# Patient Record
Sex: Male | Born: 1945 | Hispanic: Yes | Marital: Married | Smoking: Former smoker
Health system: Southern US, Community
[De-identification: ages and names within clinical notes are randomized; demographics above are authoritative.]

## PROBLEM LIST (undated history)

## (undated) DIAGNOSIS — K649 Unspecified hemorrhoids: Secondary | ICD-10-CM

## (undated) DIAGNOSIS — N138 Other obstructive and reflux uropathy: Secondary | ICD-10-CM

## (undated) DIAGNOSIS — M109 Gout, unspecified: Secondary | ICD-10-CM

## (undated) DIAGNOSIS — N401 Enlarged prostate with lower urinary tract symptoms: Secondary | ICD-10-CM

## (undated) DIAGNOSIS — R350 Frequency of micturition: Secondary | ICD-10-CM

## (undated) DIAGNOSIS — R3915 Urgency of urination: Secondary | ICD-10-CM

## (undated) DIAGNOSIS — R351 Nocturia: Secondary | ICD-10-CM

## (undated) DIAGNOSIS — N529 Male erectile dysfunction, unspecified: Secondary | ICD-10-CM

## (undated) DIAGNOSIS — I10 Essential (primary) hypertension: Secondary | ICD-10-CM

## (undated) HISTORY — DX: Benign prostatic hyperplasia with lower urinary tract symptoms: N13.8

## (undated) HISTORY — DX: Unspecified hemorrhoids: K64.9

## (undated) HISTORY — DX: Urgency of urination: R39.15

## (undated) HISTORY — DX: Essential (primary) hypertension: I10

## (undated) HISTORY — DX: Frequency of micturition: R35.0

## (undated) HISTORY — DX: Gout, unspecified: M10.9

## (undated) HISTORY — PX: COLONOSCOPY: SHX174

## (undated) HISTORY — DX: Male erectile dysfunction, unspecified: N52.9

## (undated) HISTORY — DX: Benign prostatic hyperplasia with lower urinary tract symptoms: N40.1

## (undated) HISTORY — DX: Nocturia: R35.1

---

## 2004-11-14 ENCOUNTER — Ambulatory Visit: Payer: Self-pay

## 2006-03-23 ENCOUNTER — Emergency Department: Payer: Self-pay | Admitting: Internal Medicine

## 2006-03-23 ENCOUNTER — Other Ambulatory Visit: Payer: Self-pay

## 2006-03-24 ENCOUNTER — Ambulatory Visit: Payer: Self-pay | Admitting: Internal Medicine

## 2009-01-09 ENCOUNTER — Ambulatory Visit: Payer: Self-pay | Admitting: Family Medicine

## 2010-08-27 ENCOUNTER — Ambulatory Visit: Payer: Self-pay | Admitting: Internal Medicine

## 2011-07-24 ENCOUNTER — Emergency Department: Payer: Self-pay | Admitting: Emergency Medicine

## 2013-12-22 ENCOUNTER — Ambulatory Visit: Payer: Self-pay | Admitting: Gastroenterology

## 2015-05-08 ENCOUNTER — Encounter: Payer: Self-pay | Admitting: *Deleted

## 2015-05-09 ENCOUNTER — Encounter: Payer: Self-pay | Admitting: Urology

## 2015-05-09 ENCOUNTER — Ambulatory Visit (INDEPENDENT_AMBULATORY_CARE_PROVIDER_SITE_OTHER): Payer: Medicare Other | Admitting: Urology

## 2015-05-09 VITALS — BP 136/69 | HR 69 | Ht 75.0 in | Wt 199.7 lb

## 2015-05-09 DIAGNOSIS — N138 Other obstructive and reflux uropathy: Secondary | ICD-10-CM

## 2015-05-09 DIAGNOSIS — N486 Induration penis plastica: Secondary | ICD-10-CM | POA: Diagnosis not present

## 2015-05-09 DIAGNOSIS — N401 Enlarged prostate with lower urinary tract symptoms: Secondary | ICD-10-CM

## 2015-05-09 MED ORDER — FINASTERIDE 5 MG PO TABS: 5.0000 mg | ORAL_TABLET | Freq: Every day | ORAL | Status: DC

## 2015-05-09 MED ORDER — PENTOXIFYLLINE ER 400 MG PO TBCR: 400.0000 mg | EXTENDED_RELEASE_TABLET | Freq: Three times a day (TID) | ORAL | Status: DC

## 2015-05-09 MED ORDER — TAMSULOSIN HCL 0.4 MG PO CAPS: 0.4000 mg | ORAL_CAPSULE | Freq: Every day | ORAL | Status: DC

## 2015-05-09 NOTE — Progress Notes (Signed)
05/09/2015 7:29 AM   Alejandro Steele [birth year] [id number]  Referring provider: No referring provider defined for this encounter.  Chief Complaint  Patient presents with  . Benign Prostatic Hypertrophy    patient not seen since 2014, patient states has pain in lower abdomen    HPI: [name] is a 69 year old [place] male who presents today with interpreter Trudy complaining of urinary frequency, urgency, nocturia getting up 3-4 times nightly, urinary tract infection and penile pain. Patient has not been seen in our office since 2014.  At his visit in 2014, he was diagnosed with BPH with LUTS and placed on tamsulosin and finasteride. Patient did not understand that these are long term medications and when his symptoms abated, he stopped the medication and did not return for follow-up appointments.  He has not experienced any gross hematuria, fevers, chills, nausea, vomiting or dysuria. He is having suprapubic discomfort.  He also has been having some slight penile pain with his erections. He denies any curvature with erections or pain with ejaculation. He has been noticing this pain for the last several months. It is not affected his firmness or durations of erections.     PMH: Past Medical History  Diagnosis Date  . Nocturia   . Urinary frequency   . Erectile dysfunction   . Hypertension   . Hemorrhoid   . Gout   . Urinary urgency   . BPH with obstruction/lower urinary tract symptoms     Surgical History: Past Surgical History  Procedure Laterality Date  . Colonoscopy      Home Medications:    Medication List       This list is accurate as of: 05/09/15 11:59 PM.  Always use your most recent med list.               atenolol 50 MG tablet  Commonly known as:  TENORMIN  Take 50 mg by mouth daily.     cetirizine 10 MG tablet  Commonly known as:  ZYRTEC  Take 10 mg by mouth daily.     clotrimazole 1 % cream  Commonly known as:  LOTRIMIN  Apply 1  application topically 2 (two) times daily.     colchicine 0.6 MG tablet  Take 0.6 mg by mouth daily.     finasteride 5 MG tablet  Commonly known as:  PROSCAR  Take 1 tablet (5 mg total) by mouth daily.     finasteride 5 MG tablet  Commonly known as:  PROSCAR  Take 1 tablet (5 mg total) by mouth daily.     fluticasone 50 MCG/ACT nasal spray  Commonly known as:  FLONASE  Place 2 sprays into both nostrils daily.     indomethacin 25 MG capsule  Commonly known as:  INDOCIN  Take 25 mg by mouth 2 (two) times daily with a meal.     naproxen 500 MG tablet  Commonly known as:  NAPROSYN  Take 500 mg by mouth 2 (two) times daily with a meal.     pentoxifylline 400 MG CR tablet  Commonly known as:  TRENTAL  Take 1 tablet (400 mg total) by mouth 3 (three) times daily with meals.     pravastatin 40 MG tablet  Commonly known as:  PRAVACHOL  Take 40 mg by mouth daily.     sildenafil 100 MG tablet  Commonly known as:  VIAGRA  Take 100 mg by mouth daily as needed for erectile dysfunction.     tamsulosin 0.4  MG Caps capsule  Commonly known as:  FLOMAX  Take 1 capsule (0.4 mg total) by mouth daily.     tamsulosin 0.4 MG Caps capsule  Commonly known as:  FLOMAX  Take 1 capsule (0.4 mg total) by mouth daily.        Allergies:  Allergies  Allergen Reactions  . Penicillins     Family History: Family History  Problem Relation Age of Onset  . Stroke Father   . Diabetes Mellitus II Mother   . Cancer Neg Hx     Kidney,Prostate,Bladder  . Diabetes      Social History:  reports that he has never smoked. He does not have any smokeless tobacco history on file. He reports that he does not drink alcohol or use illicit drugs.  ROS: Urological Symptom Review  Patient is experiencing the following symptoms: Frequent urination Hard to postpone urination Get up at night to urinate Urinary tract infection Penile pain (male only)    Review of Systems  Gastrointestinal (upper)   : Indigestion/heartburn  Gastrointestinal (lower) : Negative for lower GI symptoms  Constitutional : Negative for symptoms  Skin: Negative for skin symptoms  Eyes: Negative for eye symptoms  Ear/Nose/Throat : Negative for Ear/Nose/Throat symptoms  Hematologic/Lymphatic: Negative for Hematologic/Lymphatic symptoms  Cardiovascular : Negative for cardiovascular symptoms  Respiratory : Negative for respiratory symptoms  Endocrine: Excessive thirst  Musculoskeletal: Back pain Joint pain  Neurological: Negative for neurological symptoms  Psychologic: Negative for psychiatric symptoms   Physical Exam: BP 136/69 mmHg  Pulse 69  Ht  (1.905 m)  Wt 199 lb 11.2 oz (90.583 kg)  BMI 24.96 kg/m2  GU: Patient with uncircumcised phallus. Foreskin easily retracted.  Urethral meatus is patent.  No penile discharge. Patient with a Peyronie's plaque on the dorsal surface of the penis near the base, 5 mm x 20 mm.  No penile  rashes. Scrotum without lesions, cysts, rashes and/or edema.  Testicles are located scrotally bilaterally. No masses are appreciated in the testicles. Left and right epididymis are normal. Rectal: Patient with  normal sphincter tone. Perineum without scarring or rashes. No rectal masses are appreciated. Prostate is approximately 60 grams, no nodules are appreciated. Seminal vesicles are normal.   Laboratory Data: No results found for this or any previous visit. No results found for: WBC, HGB, HCT, MCV, PLT  No results found for: CREATININE  No results found for: PSA  No results found for: TESTOSTERONE  No results found for: HGBA1C  Urinalysis No results found for: COLORURINE, APPEARANCEUR, LABSPEC, PHURINE, GLUCOSEU, HGBUR, BILIRUBINUR, KETONESUR, PROTEINUR, UROBILINOGEN, NITRITE, LEUKOCYTESUR  Pertinent Imaging:   Assessment & Plan:    1. BPH with LUTS:  Patient has a prior history of BPH with lots and had been on medication for this  condition in the past. He did not understand that the medication was to be taken long-term and follow-ups were necessary. His DRE demonstrates enlargement.  We will restart tamsulosin and finasteride.  He is advised to take tamsulosin  30 minutes after a meal.  I advised him of the side effects, such as: retrograde ejaculation, sinus congestion, nasal congestion, rhinorrhea, rhinitis, dizziness, and seasonal allergic rhinitis.  The side effects of finasteride are also discussed with the patient, such as: impotence, loss of interest in sex, or trouble having an orgasm; abnormal ejaculation; swelling in your hands and/or feet; swelling and/or tenderness in your breasts.  He will follow up in one month for a PSA,  PVR and an  IPSS.    2. 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 We will start him on pentoxifylline 400 mg 1 by mouth 3 times a day. We will reevaluate this area and obtain an SHIM in one month   Return in about 1 month (around 06/08/2015) for IPSS, SHIM, PVR and PSA.  Michiel Cowboy, PA-C  [facility] [address] [place], [place] [address] [phone]

## 2015-05-10 MED ORDER — FINASTERIDE 5 MG PO TABS: 5.0000 mg | ORAL_TABLET | Freq: Every day | ORAL | Status: DC

## 2015-05-10 MED ORDER — TAMSULOSIN HCL 0.4 MG PO CAPS: 0.4000 mg | ORAL_CAPSULE | Freq: Every day | ORAL | Status: DC

## 2015-05-13 DIAGNOSIS — N401 Enlarged prostate with lower urinary tract symptoms: Secondary | ICD-10-CM

## 2015-05-13 DIAGNOSIS — N138 Other obstructive and reflux uropathy: Secondary | ICD-10-CM | POA: Insufficient documentation

## 2015-05-13 DIAGNOSIS — N486 Induration penis plastica: Secondary | ICD-10-CM | POA: Insufficient documentation

## 2015-06-08 ENCOUNTER — Ambulatory Visit: Payer: Medicare Other | Admitting: Urology

## 2015-06-18 ENCOUNTER — Telehealth: Payer: Self-pay | Admitting: *Deleted

## 2015-06-18 NOTE — Telephone Encounter (Signed)
Pt. stated that he has finished his Rx of  pentoxifylline (Trental)  and wanted to know if he should continue taking.  I explained to the pt that this Rx was written on 05/09/15, #90 w/3 refills; he has another refill left and should continue this medication until his f/u appointment on 06/22/2015 ( Friday) w/Shannon Mcgowan, PA-C.  This pt is Latino and speaks little english however he did indicate understanding and repeated his appointment date and time. . . sm

## 2015-06-22 ENCOUNTER — Encounter: Payer: Self-pay | Admitting: Urology

## 2015-06-22 ENCOUNTER — Ambulatory Visit (INDEPENDENT_AMBULATORY_CARE_PROVIDER_SITE_OTHER): Payer: Medicare Other | Admitting: Urology

## 2015-06-22 VITALS — BP 150/80 | HR 60 | Ht 69.0 in | Wt 195.4 lb

## 2015-06-22 DIAGNOSIS — N486 Induration penis plastica: Secondary | ICD-10-CM

## 2015-06-22 DIAGNOSIS — N401 Enlarged prostate with lower urinary tract symptoms: Secondary | ICD-10-CM

## 2015-06-22 DIAGNOSIS — N138 Other obstructive and reflux uropathy: Secondary | ICD-10-CM

## 2015-06-22 LAB — BLADDER SCAN AMB NON-IMAGING

## 2015-06-22 NOTE — Progress Notes (Signed)
2:50 PM   Alejandro Steele 04/20/1946 [id number]  Referring provider: No referring provider defined for this encounter.  Chief Complaint  Patient presents with  . Benign Prostatic Hypertrophy    HPI: [name] is a 69 year old [place] male who presents today with interpreter, Orson Slick, for follow-up after being placed on tamsulosin and finasteride for symptoms of  urinary frequency, urgency, nocturia getting up 3-4 times nightly, urinary tract infection and penile pain.    Patient had not been seen in our office since 2014.  At his visit in 2014, he was diagnosed with BPH with LUTS and placed on tamsulosin and finasteride. Patient did not understand that these are long term medications and when his symptoms abated, he stopped the medication and did not return for follow-up appointments.  His urinary symptoms did return and he presented to our clinic 1 month ago for further evaluation and treatment.  He has not experienced any gross hematuria, fevers, chills, nausea, vomiting or dysuria.   Today, he states he has felt some improvement since starting the tamsulosin and finasteride.      IPSS      06/22/15 0900       International Prostate Symptom Score   How often have you had the sensation of not emptying your bladder? Almost always     How often have you had to urinate less than every two hours? Almost always     How often have you found you stopped and started again several times when you urinated? More than half the time     How often have you found it difficult to postpone urination? Almost always     How often have you had a weak urinary stream? More than half the time     How often have you had to strain to start urination? Not at All     How many times did you typically get up at night to urinate? 4 Times     Total IPSS Score 27     Quality of Life due to urinary symptoms   If you were to spend the rest of your life with your urinary condition just the way it is  now how would you feel about that? Mostly Disatisfied        Score:  1-7 Mild 8-19 Moderate 20-35 Severe  He was also has been having some slight penile pain with his erections. He denied any curvature with erections or pain with ejaculation. He has been noticing this pain for the last several months. It is not affected his firmness or durations of erections. He was found to have a Peyronie's plaque on the dorsal surface of his penis during his exam last month. He was placed on pentoxifylline 400 mg 1 by mouth 3 times daily. He states he has noticed the penile pain has diminished.  PMH: Past Medical History  Diagnosis Date  . Nocturia   . Urinary frequency   . Erectile dysfunction   . Hypertension   . Hemorrhoid   . Gout   . Urinary urgency   . BPH with obstruction/lower urinary tract symptoms     Surgical History: Past Surgical History  Procedure Laterality Date  . Colonoscopy      Home Medications:    Medication List       This list is accurate as of: 06/22/15 11:59 PM.  Always use your most recent med list.  atenolol 50 MG tablet  Commonly known as:  TENORMIN  Take 50 mg by mouth daily.     clotrimazole 1 % cream  Commonly known as:  LOTRIMIN  Apply 1 application topically 2 (two) times daily.     colchicine 0.6 MG tablet  Take 0.6 mg by mouth daily.     finasteride 5 MG tablet  Commonly known as:  PROSCAR  Take 1 tablet (5 mg total) by mouth daily.     indomethacin 25 MG capsule  Commonly known as:  INDOCIN  Take 25 mg by mouth 2 (two) times daily with a meal.     pentoxifylline 400 MG CR tablet  Commonly known as:  TRENTAL  Take 1 tablet (400 mg total) by mouth 3 (three) times daily with meals.     pravastatin 20 MG tablet  Commonly known as:  PRAVACHOL     sildenafil 100 MG tablet  Commonly known as:  VIAGRA  Take 100 mg by mouth daily as needed for erectile dysfunction.     tamsulosin 0.4 MG Caps capsule  Commonly known as:   FLOMAX  Take 1 capsule (0.4 mg total) by mouth daily.        Allergies:  Allergies  Allergen Reactions  . Penicillins     Family History: Family History  Problem Relation Age of Onset  . Stroke Father   . Diabetes Mellitus II Mother   . Cancer Neg Hx     Kidney,Prostate,Bladder  . Diabetes      Social History:  reports that he has never smoked. He does not have any smokeless tobacco history on file. He reports that he does not drink alcohol or use illicit drugs.  ROS: Urological Symptom Review  Patient is experiencing the following symptoms: Frequent urination Hard to postpone urination Get up at night to urinate Urinary tract infection Penile pain (male only)    Review of Systems  Gastrointestinal (upper)  : Indigestion/heartburn  Gastrointestinal (lower) : Negative for lower GI symptoms  Constitutional : Negative for symptoms  Skin: Negative for skin symptoms  Eyes: Negative for eye symptoms  Ear/Nose/Throat : Negative for Ear/Nose/Throat symptoms  Hematologic/Lymphatic: Negative for Hematologic/Lymphatic symptoms  Cardiovascular : Negative for cardiovascular symptoms  Respiratory : Negative for respiratory symptoms  Endocrine: Excessive thirst  Musculoskeletal: Back pain Joint pain  Neurological: Negative for neurological symptoms  Psychologic: Negative for psychiatric symptoms   Physical Exam: BP 150/80 mmHg  Pulse 60  Ht 5\' 9"  (1.753 m)  Wt 195 lb 6.4 oz (88.633 kg)  BMI 28.84 kg/m2  GU: Patient with uncircumcised phallus. Foreskin easily retracted.  Urethral meatus is patent.  No penile discharge. Patient with a Peyronie's plaque on the dorsal surface of the penis near the base, 5 mm x 8 mm.  No penile  rashes. Scrotum without lesions, cysts, rashes and/or edema.  Testicles are located scrotally bilaterally. No masses are appreciated in the testicles. Left and right epididymis are normal. Rectal: Patient with  normal sphincter  tone. Perineum without scarring or rashes. No rectal masses are appreciated. Prostate is approximately 60 grams, no nodules are appreciated. Seminal vesicles are normal.   Laboratory Data: Results for orders placed or performed in visit on 06/22/15  PSA  Result Value Ref Range   Prostate Specific Ag, Serum 0.7 0.0 - 4.0 ng/mL  BLADDER SCAN AMB NON-IMAGING  Result Value Ref Range   Scan Result 14ml    Pertinent Imaging:   Assessment & Plan:  1. BPH with LUTS:  Patient's symptoms are better since restarting the tamsulosin and finasteride.  His IPSS score today is 27 and he is mostly dissatisfied with the urinary results. But when asked directly, he states his symptoms are improving since restarting the tamsulosin and finasteride.  We will continue these medications and the patient will follow up in 3 months for a PVR and an IPSS.  2. 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 On today's exam is is reduced.  He will continue the pentoxifylline 400 mg 1 by mouth 3 times a day. We will reevaluate this area and obtain an SHIM in three months.   Return in about 3 months (around 09/22/2015) for IPSS and PVR and exam.  Michiel Cowboy, [facility] [address] [place], [place] [address] [phone]

## 2015-06-23 LAB — PSA: PROSTATE SPECIFIC AG, SERUM: 0.7 ng/mL (ref 0.0–4.0)

## 2015-06-25 ENCOUNTER — Telehealth: Payer: Self-pay

## 2015-06-25 NOTE — Telephone Encounter (Signed)
-----   Message from Ashley Brandon, MD sent at 06/25/2015 12:19 PM EDT ----- PSA 0.7, wnl.  Please let patient know.    Ashley Brandon, MD  

## 2015-06-25 NOTE — Telephone Encounter (Signed)
No vm has been set up yet.

## 2015-06-27 NOTE — Telephone Encounter (Signed)
-----   Message from Ashley Brandon, MD sent at 06/25/2015 12:19 PM EDT ----- PSA 0.7, wnl.  Please let patient know.    Ashley Brandon, MD  

## 2015-06-28 NOTE — Telephone Encounter (Signed)
Will send letter.

## 2015-06-28 NOTE — Telephone Encounter (Signed)
-----   Message from Vanna Scotland, MD sent at 06/25/2015 12:19 PM EDT ----- PSA 0.7, wnl.  Please let patient know.    Vanna Scotland, MD

## 2015-08-27 ENCOUNTER — Ambulatory Visit: Payer: Medicare Other | Admitting: Obstetrics and Gynecology

## 2015-08-28 ENCOUNTER — Ambulatory Visit: Payer: Medicare Other | Admitting: Obstetrics and Gynecology

## 2015-08-29 ENCOUNTER — Encounter: Payer: Self-pay | Admitting: Obstetrics and Gynecology

## 2015-08-29 ENCOUNTER — Ambulatory Visit (INDEPENDENT_AMBULATORY_CARE_PROVIDER_SITE_OTHER): Payer: Medicare Other | Admitting: Obstetrics and Gynecology

## 2015-08-29 VITALS — BP 116/73 | HR 61 | Resp 18 | Ht 70.0 in | Wt 190.7 lb

## 2015-08-29 DIAGNOSIS — N4889 Other specified disorders of penis: Secondary | ICD-10-CM

## 2015-08-29 DIAGNOSIS — N401 Enlarged prostate with lower urinary tract symptoms: Secondary | ICD-10-CM | POA: Diagnosis not present

## 2015-08-29 DIAGNOSIS — N138 Other obstructive and reflux uropathy: Secondary | ICD-10-CM

## 2015-08-29 LAB — URINALYSIS, COMPLETE
BILIRUBIN UA: NEGATIVE
GLUCOSE, UA: NEGATIVE
Ketones, UA: NEGATIVE
Leukocytes, UA: NEGATIVE
NITRITE UA: NEGATIVE
Protein, UA: NEGATIVE
SPEC GRAV UA: 1.01 (ref 1.005–1.030)
UUROB: 0.2 mg/dL (ref 0.2–1.0)
pH, UA: 5.5 (ref 5.0–7.5)

## 2015-08-29 LAB — MICROSCOPIC EXAMINATION
Bacteria, UA: NONE SEEN
Epithelial Cells (non renal): NONE SEEN /hpf (ref 0–10)
Renal Epithel, UA: NONE SEEN /hpf
WBC, UA: NONE SEEN /hpf (ref 0–?)

## 2015-08-29 MED ORDER — FINASTERIDE 5 MG PO TABS
5.0000 mg | ORAL_TABLET | Freq: Every day | ORAL | Status: DC
Start: 2015-08-29 — End: 2016-07-03

## 2015-08-29 MED ORDER — TAMSULOSIN HCL 0.4 MG PO CAPS: 0.4000 mg | ORAL_CAPSULE | Freq: Every day | ORAL | Status: DC

## 2015-08-29 NOTE — Progress Notes (Signed)
08/29/2015 10:21 AM   Alejandro Steele June 25, 1946 [id number]  Referring provider: No referring provider defined for this encounter.  Chief Complaint  Patient presents with  . Benign Prostatic Hypertrophy    HPI:  Patient presents for f/u for BPH with LUTS and penile pain.Today, he states he has felt much improvement in urinary symptoms since starting the tamsulosin and finasteride. However he continues to experience urgency, nocturia 4 times per night and occasional urge incontinence which he is significantly bothered by. His I-PSS score has improved from 27 down to 14 today though he states he remains unhappy with his current symptoms.  He penile pain has completely resolved.  He states no change in penile curvature that has been present his whole life.  Previous Complaint History: Alejandro Steele is a 69 year old [place] male who presents today with interpreter, Alejandro Steele, for follow-up after being placed on tamsulosin and finasteride for symptoms of urinary frequency, urgency, nocturia getting up 3-4 times nightly, urinary tract infection and penile pain.   Patient had not been seen in our office since 2014. At his visit in 2014, he was diagnosed with BPH with LUTS and placed on tamsulosin and finasteride. Patient did not understand that these are long term medications and when his symptoms abated, he stopped the medication and did not return for follow-up appointments. His urinary symptoms did return and he presented to our clinic 1 month ago for further evaluation and treatment. He has not experienced any gross hematuria, fevers, chills, nausea, vomiting or dysuria.    PMH: Past Medical History  Diagnosis Date  . Nocturia   . Urinary frequency   . Erectile dysfunction   . Hypertension   . Hemorrhoid   . Gout   . Urinary urgency   . BPH with obstruction/lower urinary tract symptoms     Surgical History: Past Surgical History  Procedure Laterality Date  .  Colonoscopy      Home Medications:    Medication List       This list is accurate as of: 08/29/15 11:59 PM.  Always use your most recent med list.               atenolol 50 MG tablet  Commonly known as:  TENORMIN  Take 50 mg by mouth daily.     clotrimazole 1 % cream  Commonly known as:  LOTRIMIN  Apply 1 application topically 2 (two) times daily.     colchicine 0.6 MG tablet  Take 0.6 mg by mouth daily.     finasteride 5 MG tablet  Commonly known as:  PROSCAR  Take 1 tablet (5 mg total) by mouth daily.     HYDROcodone-acetaminophen 5-325 MG tablet  Commonly known as:  NORCO/VICODIN  take 1 tablet by mouth every 6 hours if needed for SEVERE PAIN     indomethacin 25 MG capsule  Commonly known as:  INDOCIN  Take 25 mg by mouth 2 (two) times daily with a meal.     pravastatin 20 MG tablet  Commonly known as:  PRAVACHOL     sildenafil 100 MG tablet  Commonly known as:  VIAGRA  Take 100 mg by mouth daily as needed for erectile dysfunction.     tamsulosin 0.4 MG Caps capsule  Commonly known as:  FLOMAX  Take 1 capsule (0.4 mg total) by mouth daily.        Allergies:  Allergies  Allergen Reactions  . Penicillins     Family History: Family History  Problem Relation Age of Onset  . Stroke Father   . Diabetes Mellitus II Mother   . Cancer Neg Hx     Kidney,Prostate,Bladder  . Diabetes      Social History:  reports that he has never smoked. He does not have any smokeless tobacco history on file. He reports that he does not drink alcohol or use illicit drugs.  ROS: UROLOGY Frequent Urination?: Yes Hard to postpone urination?: Yes Burning/pain with urination?: No Get up at night to urinate?: Yes Leakage of urine?: No Urine stream starts and stops?: No Trouble starting stream?: No Do you have to strain to urinate?: No Blood in urine?: No Urinary tract infection?: No Sexually transmitted disease?: No Injury to kidneys or bladder?: No Painful  intercourse?: No Weak stream?: No Erection problems?: Yes Penile pain?: No  Gastrointestinal Nausea?: No Vomiting?: No Indigestion/heartburn?: No Diarrhea?: No Constipation?: No  Constitutional Fever: No Night sweats?: No Weight loss?: No Fatigue?: No  Skin Skin rash/lesions?: No Itching?: No  Eyes Blurred vision?: No Double vision?: No  Ears/Nose/Throat Sore throat?: No Sinus problems?: No  Hematologic/Lymphatic Swollen glands?: No Easy bruising?: No  Cardiovascular Leg swelling?: No Chest pain?: No  Respiratory Cough?: No Shortness of breath?: No  Endocrine Excessive thirst?: Yes  Musculoskeletal Back pain?: No Joint pain?: No  Neurological Headaches?: No Dizziness?: No  Psychologic Depression?: No Anxiety?: No  Physical Exam: BP 116/73 mmHg  Pulse 61  Resp 18  Ht 5\' 10"  (1.778 m)  Wt 190 lb 11.2 oz (86.501 kg)  BMI 27.36 kg/m2  Constitutional:  Alert and oriented, No acute distress. HEENT: [place] AT, moist mucus membranes.  Trachea midline, no masses. Cardiovascular: No clubbing, cyanosis, or edema. Respiratory: Normal respiratory effort, no increased work of breathing. GI: Abdomen is soft, nontender, nondistended, no abdominal masses GU: No CVA tenderness, no palpable penile plaque  Skin: No rashes, bruises or suspicious lesions. Lymph: No cervical or inguinal adenopathy. Neurologic: Grossly intact, no focal deficits, moving all 4 extremities. Psychiatric: Normal mood and affect.  Laboratory Data: No results found for: WBC, HGB, HCT, MCV, PLT  No results found for: CREATININE  Lab Results  Component Value Date   PSA 0.7 06/22/2015    No results found for: TESTOSTERONE  No results found for: HGBA1C  Urinalysis    Component Value Date/Time   GLUCOSEU Negative 08/29/2015 1029   BILIRUBINUR Negative 08/29/2015 1029   NITRITE Negative 08/29/2015 1029   LEUKOCYTESUR Negative 08/29/2015 1029    Pertinent  Imaging:   Assessment & Plan:    1. BPH with LUTS- UA unremarkable. PVR 36 mL's. Patient is very unhappy with continued symptoms.  We discussed scheduling a cystoscopy in the future for further evaluation of his bladder outlet. Patient states he would like to continue his medications for a few more months. He is planning to go to [place] in a few weeks and will return in January. He would like to follow up after he returns for recheck prior to scheduling a cystoscopy.   3. Peyronie's - No penile pain or discomfort.  Patient reports same curvature his whole life. We will discontinue pentoxyline today and continue to monitor symptoms.  There are no diagnoses linked to this encounter.  Return for f/u with Carollee HerterShannon in February.  Earlie LouLindsay Fallan Mccarey, FNP  [facility] [address], [address] [place], [place] [address] [phone]

## 2015-08-29 NOTE — Patient Instructions (Addendum)
Stop pentoxifylline. Continue Finasteride and Flomax daily.

## 2015-09-24 ENCOUNTER — Ambulatory Visit: Payer: Medicare Other | Admitting: Urology

## 2016-01-01 ENCOUNTER — Ambulatory Visit: Payer: Medicare Other | Admitting: Urology

## 2016-01-03 ENCOUNTER — Encounter: Payer: Self-pay | Admitting: Urology

## 2016-01-03 ENCOUNTER — Ambulatory Visit (INDEPENDENT_AMBULATORY_CARE_PROVIDER_SITE_OTHER): Payer: Medicare Other | Admitting: Urology

## 2016-01-03 VITALS — BP 156/78 | HR 57 | Ht 70.0 in | Wt 196.5 lb

## 2016-01-03 DIAGNOSIS — N138 Other obstructive and reflux uropathy: Secondary | ICD-10-CM

## 2016-01-03 DIAGNOSIS — N486 Induration penis plastica: Secondary | ICD-10-CM | POA: Diagnosis not present

## 2016-01-03 DIAGNOSIS — N401 Enlarged prostate with lower urinary tract symptoms: Secondary | ICD-10-CM

## 2016-01-03 LAB — BLADDER SCAN AMB NON-IMAGING: SCAN RESULT: 0

## 2016-01-03 NOTE — Progress Notes (Signed)
9:36 AM   Alejandro Steele 1946/05/06 [id number]  Referring provider: No referring provider defined for this encounter.  Chief Complaint  Patient presents with  . Benign Prostatic Hypertrophy    follow up    HPI: Patient is a 70 year old [place] male who presents with interpreter, Alejandro Steele, for follow-up to discuss his results with tamsulosin and finasteride.  He also has had a history of Peyronie's disease and we will inquire about the status at today's visit.  Today, he states he has felt much improvement in urinary symptoms since starting the tamsulosin and finasteride.  His I-PSS score has improved from 14 down to 13 today.  He is mostly satisfied with his urinary symptoms at this time.  His PVR is 0mL.        IPSS      01/03/16 0900       International Prostate Symptom Score   How often have you had the sensation of not emptying your bladder? Less than 1 in 5     How often have you had to urinate less than every two hours? More than half the time     How often have you found you stopped and started again several times when you urinated? Less than 1 in 5 times     How often have you found it difficult to postpone urination? More than half the time     How often have you had a weak urinary stream? Less than 1 in 5 times     How often have you had to strain to start urination? Not at All     How many times did you typically get up at night to urinate? 2 Times     Total IPSS Score 13     Quality of Life due to urinary symptoms   If you were to spend the rest of your life with your urinary condition just the way it is now how would you feel about that? Mostly Satisfied        Score:  1-7 Mild 8-19 Moderate 20-35 Severe   He penile pain has completely resolved.  He states no change in penile curvature that has been present his whole life.  His SHIM score is 19.      SHIM      01/03/16 0922       SHIM: Over the last 6 months:   How do you rate your confidence  that you could get and keep an erection? High     When you had erections with sexual stimulation, how often were your erections hard enough for penetration (entering your partner)? Most Times (much more than half the time)     During sexual intercourse, how often were you able to maintain your erection after you had penetrated (entered) your partner? Slightly Difficult     During sexual intercourse, how difficult was it to maintain your erection to completion of intercourse? Slightly Difficult     When you attempted sexual intercourse, how often was it satisfactory for you? Difficult     SHIM Total Score   SHIM 19        Score: 1-7 Severe ED 8-11 Moderate ED 12-16 Mild-Moderate ED 17-21 Mild ED 22-25 No ED  He has not experienced any gross hematuria, fevers, chills, nausea, vomiting or dysuria.    PMH: Past Medical History  Diagnosis Date  . Nocturia   . Urinary frequency   . Erectile dysfunction   . Hypertension   .  Hemorrhoid   . Gout   . Urinary urgency   . BPH with obstruction/lower urinary tract symptoms     Surgical History: Past Surgical History  Procedure Laterality Date  . Colonoscopy    . None      Home Medications:    Medication List       This list is accurate as of: 01/03/16  9:36 AM.  Always use your most recent med list.               atenolol 50 MG tablet  Commonly known as:  TENORMIN  Take 50 mg by mouth daily.     clotrimazole 1 % cream  Commonly known as:  LOTRIMIN  Apply 1 application topically 2 (two) times daily.     colchicine 0.6 MG tablet  Take 0.6 mg by mouth daily.     finasteride 5 MG tablet  Commonly known as:  PROSCAR  Take 1 tablet (5 mg total) by mouth daily.     HYDROcodone-acetaminophen 5-325 MG tablet  Commonly known as:  NORCO/VICODIN  Reported on 01/03/2016     indomethacin 25 MG capsule  Commonly known as:  INDOCIN  Take 25 mg by mouth 2 (two) times daily with a meal. Reported on 01/03/2016     pravastatin  20 MG tablet  Commonly known as:  PRAVACHOL     sildenafil 100 MG tablet  Commonly known as:  VIAGRA  Take 100 mg by mouth daily as needed for erectile dysfunction. Reported on 01/03/2016     tamsulosin 0.4 MG Caps capsule  Commonly known as:  FLOMAX  Take 1 capsule (0.4 mg total) by mouth daily.        Allergies:  Allergies  Allergen Reactions  . Penicillins     Family History: Family History  Problem Relation Age of Onset  . Stroke Father   . Diabetes Mellitus II Mother   . Cancer Neg Hx     Kidney,Prostate,Bladder  . Diabetes      Social History:  reports that he has quit smoking. He does not have any smokeless tobacco history on file. He reports that he does not drink alcohol or use illicit drugs.  ROS: UROLOGY Frequent Urination?: Yes Hard to postpone urination?: Yes Burning/pain with urination?: No Get up at night to urinate?: Yes Leakage of urine?: No Urine stream starts and stops?: No Trouble starting stream?: No Do you have to strain to urinate?: No Blood in urine?: No Urinary tract infection?: No Sexually transmitted disease?: No Injury to kidneys or bladder?: No Painful intercourse?: No Weak stream?: No Erection problems?: No Penile pain?: No  Gastrointestinal Nausea?: No Vomiting?: No Indigestion/heartburn?: No Diarrhea?: No Constipation?: No  Constitutional Fever: No Night sweats?: No Weight loss?: No Fatigue?: No  Skin Skin rash/lesions?: No Itching?: No  Eyes Blurred vision?: No Double vision?: No  Ears/Nose/Throat Sore throat?: No Sinus problems?: No  Hematologic/Lymphatic Swollen glands?: No Easy bruising?: No  Cardiovascular Leg swelling?: No Chest pain?: No  Respiratory Cough?: No Shortness of breath?: No  Endocrine Excessive thirst?: No  Musculoskeletal Back pain?: Yes Joint pain?: Yes  Neurological Headaches?: No Dizziness?: No  Psychologic Depression?: No Anxiety?: No  Physical Exam: BP 156/78  mmHg  Pulse 57  Ht  (1.778 m)  Wt 196 lb 8 oz (89.132 kg)  BMI 28.19 kg/m2  Constitutional:  Alert and oriented, No acute distress. HEENT: [place] AT, moist mucus membranes.  Trachea midline, no masses. Cardiovascular: No clubbing, cyanosis, or  edema. Respiratory: Normal respiratory effort, no increased work of breathing. GI: Abdomen is soft, nontender, nondistended, no abdominal masses GU: No CVA tenderness, no palpable penile plaque  GU: No CVA tenderness.  No bladder fullness or masses.  Patient with uncircumcised phallus. Foreskin easily retracted Urethral meatus is patent.  No penile discharge. No penile lesions or rashes. Scrotum without lesions, cysts, rashes and/or edema.  Testicles are located scrotally bilaterally. No masses are appreciated in the testicles. Left and right epididymis are normal. Rectal: Patient with  normal sphincter tone. Anus and perineum without scarring or rashes. No rectal masses are appreciated. Prostate is approximately 50 grams, no nodules are appreciated. Seminal vesicles are normal. Skin: No rashes, bruises or suspicious lesions. Lymph: No cervical or inguinal adenopathy. Neurologic: Grossly intact, no focal deficits, moving all 4 extremities. Psychiatric: Normal mood and affect.  Laboratory Data: PSA history  0.7 ng/mL on 06/25/2015   Pertinent Imaging: Results for TEOMAN, GIRAUD (MRN [id number]) as of 01/03/2016 09:26  Ref. Range 01/03/2016 09:25  Scan Result Unknown 0    Assessment & Plan:    1. BPH with LUTS:   IPSS score is 13/2.  His PVR is 0 mL.  We will continue to monitor.  He will return to clinic in 6 months for an IPSS score, exam and PSA.    3. Peyronie's:   No penile pain or discomfort.  Patient reports same curvature his whole life. We will continue to monitor.   Return in about 6 months (around 07/02/2016) for IPSS score and SHIM score.  Michiel Cowboy, PA-C  [facility] [address],  [address] [place], [place] [address] [phone]

## 2016-02-06 ENCOUNTER — Other Ambulatory Visit: Payer: Self-pay | Admitting: Family Medicine

## 2016-02-06 ENCOUNTER — Ambulatory Visit
Admission: RE | Admit: 2016-02-06 | Discharge: 2016-02-06 | Disposition: A | Discharge status: Home / Self Care | Payer: Medicare Other | Source: Ambulatory Visit | Attending: Family Medicine | Admitting: Family Medicine

## 2016-02-06 DIAGNOSIS — M25462 Effusion, left knee: Secondary | ICD-10-CM | POA: Insufficient documentation

## 2016-02-06 DIAGNOSIS — M25562 Pain in left knee: Secondary | ICD-10-CM

## 2016-03-26 DIAGNOSIS — M1712 Unilateral primary osteoarthritis, left knee: Secondary | ICD-10-CM | POA: Insufficient documentation

## 2016-06-25 ENCOUNTER — Other Ambulatory Visit: Payer: Self-pay

## 2016-06-25 DIAGNOSIS — N400 Enlarged prostate without lower urinary tract symptoms: Secondary | ICD-10-CM

## 2016-06-26 ENCOUNTER — Other Ambulatory Visit: Payer: Medicare Other

## 2016-06-26 DIAGNOSIS — N400 Enlarged prostate without lower urinary tract symptoms: Secondary | ICD-10-CM

## 2016-06-27 LAB — PSA: PROSTATE SPECIFIC AG, SERUM: 0.5 ng/mL (ref 0.0–4.0)

## 2016-07-03 ENCOUNTER — Ambulatory Visit (INDEPENDENT_AMBULATORY_CARE_PROVIDER_SITE_OTHER): Payer: Medicare Other | Admitting: Urology

## 2016-07-03 ENCOUNTER — Encounter: Payer: Self-pay | Admitting: Urology

## 2016-07-03 VITALS — BP 122/68 | HR 66 | Ht 68.0 in | Wt 191.9 lb

## 2016-07-03 DIAGNOSIS — N486 Induration penis plastica: Secondary | ICD-10-CM | POA: Diagnosis not present

## 2016-07-03 DIAGNOSIS — N401 Enlarged prostate with lower urinary tract symptoms: Secondary | ICD-10-CM | POA: Diagnosis not present

## 2016-07-03 DIAGNOSIS — N528 Other male erectile dysfunction: Secondary | ICD-10-CM | POA: Diagnosis not present

## 2016-07-03 DIAGNOSIS — N138 Other obstructive and reflux uropathy: Secondary | ICD-10-CM

## 2016-07-03 DIAGNOSIS — N529 Male erectile dysfunction, unspecified: Secondary | ICD-10-CM

## 2016-07-03 MED ORDER — TAMSULOSIN HCL 0.4 MG PO CAPS: 0.4000 mg | ORAL_CAPSULE | Freq: Every day | ORAL | 4 refills | Status: DC

## 2016-07-03 MED ORDER — FINASTERIDE 5 MG PO TABS: 5.0000 mg | ORAL_TABLET | Freq: Every day | ORAL | 4 refills | Status: DC

## 2016-07-03 NOTE — Progress Notes (Signed)
7:11 PM   Alejandro AskewRolando Canales Steele 1946-02-15 [id number]  Referring provider: No referring provider defined for this encounter.  Chief Complaint  Patient presents with  . Follow-up    BPH   pepsis HPI: Patient is a 70 year old [place] male who presents with interpreter, [name], for a 6 month follow-up for BPH with LUTS and ED.      BPH WITH LUTS His IPSS score today is 22 , which is severe lower urinary tract symptomatology. He is unhappy with his quality life due to his urinary symptoms.  His previous IPSS score was 13/2.  His previous PVR is 0 mL.  His major complaint today frequency of urination, nocturia  4 and a mild urinary leakage.  He has had these symptoms for several months.  He denies any dysuria, hematuria or suprapubic pain.   He currently taking tamsulosin 0.4 mg and finasteride 5 mg daily.   He also denies any recent fevers, chills, nausea or vomiting.  He does not have a family history of PCa.   He does admit to drinking large amounts of Pepsi and Coca-Cola as a substitute for alcohol. Patient states he did stopped consuming alcohol approximately 20 years ago.      IPSS    Row Name 07/03/16 0900         International Prostate Symptom Score   How often have you had the sensation of not emptying your bladder? Almost always     How often have you had to urinate less than every two hours? More than half the time     How often have you found you stopped and started again several times when you urinated? About half the time     How often have you found it difficult to postpone urination? Almost always     How often have you had a weak urinary stream? Less than 1 in 5 times     How often have you had to strain to start urination? Not at All     How many times did you typically get up at night to urinate? 4 Times     Total IPSS Score 22       Quality of Life due to urinary symptoms   If you were to spend the rest of your life with your urinary condition just the way it  is now how would you feel about that? Unhappy        Score:  1-7 Mild 8-19 Moderate 20-35 Severe  Erectile dysfunction His SHIM score is 14, which is mild to moderate ED.   His previous SHIM score was 19.  He has been having difficulty with erections for several years.   His major complaint is a slight curvature to his penis.  His libido is preserved.   His risk factors for ED are age, BPH and HTN .  He denies any painful erections.   He has tried Viagra in the past.        SHIM    Row Name 07/03/16 0939         SHIM: Over the last 6 months:   How do you rate your confidence that you could get and keep an erection? Very Low     When you had erections with sexual stimulation, how often were your erections hard enough for penetration (entering your partner)? Almost Never or Never     During sexual intercourse, how often were you able to maintain your erection after you had penetrated (  entered) your partner? Slightly Difficult     During sexual intercourse, how difficult was it to maintain your erection to completion of intercourse? Slightly Difficult     When you attempted sexual intercourse, how often was it satisfactory for you? Slightly Difficult       SHIM Total Score   SHIM 14        Score: 1-7 Severe ED 8-11 Moderate ED 12-16 Mild-Moderate ED 17-21 Mild ED 22-25 No ED   PMH: Past Medical History:  Diagnosis Date  . BPH with obstruction/lower urinary tract symptoms   . Erectile dysfunction   . Gout   . Hemorrhoid   . Hypertension   . Nocturia   . Urinary frequency   . Urinary urgency     Surgical History: Past Surgical History:  Procedure Laterality Date  . COLONOSCOPY    . none      Home Medications:    Medication List       Accurate as of 07/03/16 11:59 PM. Always use your most recent med list.          atenolol 50 MG tablet Commonly known as:  TENORMIN Take 50 mg by mouth daily.   clotrimazole 1 % cream Commonly known as:   LOTRIMIN Apply 1 application topically 2 (two) times daily.   colchicine 0.6 MG tablet Take 0.6 mg by mouth daily.   finasteride 5 MG tablet Commonly known as:  PROSCAR Take 1 tablet (5 mg total) by mouth daily.   HYDROcodone-acetaminophen 5-325 MG tablet Commonly known as:  NORCO/VICODIN Reported on 01/03/2016   indomethacin 25 MG capsule Commonly known as:  INDOCIN Take 25 mg by mouth 2 (two) times daily with a meal. Reported on 01/03/2016   pravastatin 20 MG tablet Commonly known as:  PRAVACHOL   sildenafil 100 MG tablet Commonly known as:  VIAGRA Take 100 mg by mouth daily as needed for erectile dysfunction. Reported on 01/03/2016   tamsulosin 0.4 MG Caps capsule Commonly known as:  FLOMAX Take 1 capsule (0.4 mg total) by mouth daily.       Allergies:  Allergies  Allergen Reactions  . Penicillins     Other reaction(s): Unknown    Family History: Family History  Problem Relation Age of Onset  . Stroke Father   . Diabetes Mellitus II Mother   . Cancer Neg Hx     Kidney,Prostate,Bladder  . Diabetes      Social History:  reports that he has quit smoking. He does not have any smokeless tobacco history on file. He reports that he does not drink alcohol or use drugs.  ROS: UROLOGY Frequent Urination?: Yes Hard to postpone urination?: No Burning/pain with urination?: No Get up at night to urinate?: Yes Leakage of urine?: Yes Urine stream starts and stops?: No Trouble starting stream?: No Do you have to strain to urinate?: No Blood in urine?: No Urinary tract infection?: No Sexually transmitted disease?: No Injury to kidneys or bladder?: No Painful intercourse?: No Weak stream?: No Erection problems?: No Penile pain?: No  Gastrointestinal Nausea?: No Vomiting?: No Indigestion/heartburn?: Yes Diarrhea?: No Constipation?: No  Constitutional Fever: No Night sweats?: No Weight loss?: No Fatigue?: Yes  Skin Skin rash/lesions?: Yes Itching?:  No  Eyes Blurred vision?: No Double vision?: No  Ears/Nose/Throat Sore throat?: No Sinus problems?: Yes  Hematologic/Lymphatic Swollen glands?: No Easy bruising?: No  Cardiovascular Leg swelling?: No Chest pain?: No  Respiratory Cough?: No Shortness of breath?: No  Endocrine Excessive thirst?: Yes  Musculoskeletal Back pain?: Yes Joint pain?: No  Neurological Headaches?: No Dizziness?: No  Psychologic Depression?: No Anxiety?: No  Physical Exam: BP 122/68   Pulse 66   Ht 5\' 8"  (1.727 m)   Wt 191 lb 14.4 oz (87 kg)   BMI 29.18 kg/m   Constitutional:  Alert and oriented, No acute distress. HEENT: [place] AT, moist mucus membranes.  Trachea midline, no masses. Cardiovascular: No clubbing, cyanosis, or edema. Respiratory: Normal respiratory effort, no increased work of breathing. GI: Abdomen is soft, nontender, nondistended, no abdominal masses GU: No CVA tenderness, no palpable penile plaque  GU: No CVA tenderness.  No bladder fullness or masses.  Patient with uncircumcised phallus. Foreskin easily retracted Urethral meatus is patent.  No penile discharge. No penile lesions or rashes. Scrotum without lesions, cysts, rashes and/or edema.  Testicles are located scrotally bilaterally. No masses are appreciated in the testicles. Left and right epididymis are normal. Rectal: Patient with  normal sphincter tone. Anus and perineum without scarring or rashes. No rectal masses are appreciated. Prostate is approximately 50 grams, no nodules are appreciated. Seminal vesicles are normal. Skin: No rashes, bruises or suspicious lesions. Lymph: No cervical or inguinal adenopathy. Neurologic: Grossly intact, no focal deficits, moving all 4 extremities. Psychiatric: Normal mood and affect.  Laboratory Data: PSA history  0.7 ng/mL on 06/25/2015  0.5 ng/mL on 06/26/2016   Assessment & Plan:    1. BPH with LUTS  - IPSS score is 22/5, it is worsening  - Continue conservative  management, avoiding bladder irritants and timed voiding's- specifically giving up Pepsi and Coca-Cola  - Continue tamsulosin 0.4 mg daily and finasteride 5 mg daily, refills given today  - RTC in one month for IPSS  2. Erectile dysfunction  -SHIM score of 14  - continue Viagra  3. Peyronie's:   No penile pain or discomfort.  Patient reports same curvature his whole life. We will continue to monitor.   Return in about 1 month (around 08/03/2016) for IPSS .  Michiel CowboySHANNON Lavonda Thal, PA-C  [facility] [address], [address] [place], [place] [address] [phone]

## 2016-07-17 ENCOUNTER — Telehealth: Payer: Self-pay | Admitting: Urology

## 2016-07-17 NOTE — Telephone Encounter (Signed)
No answer. Not able to leave message.

## 2016-07-18 NOTE — Telephone Encounter (Signed)
Spoke with pt via daughter in reference to medications. Pt stated that he just wanted to make sure that Carollee HerterShannon did not add any new medications and was supposed to continue flomax and finasteride. Reinforced with pt from Shannon's last note, no new medications were added and he should continue current medication regimen. Pt voiced understanding.

## 2016-08-04 ENCOUNTER — Encounter: Payer: Self-pay | Admitting: Urology

## 2016-08-04 ENCOUNTER — Ambulatory Visit (INDEPENDENT_AMBULATORY_CARE_PROVIDER_SITE_OTHER): Payer: Medicare Other | Admitting: Urology

## 2016-08-04 VITALS — BP 135/70 | HR 60 | Wt 198.0 lb

## 2016-08-04 DIAGNOSIS — R351 Nocturia: Secondary | ICD-10-CM

## 2016-08-04 DIAGNOSIS — N138 Other obstructive and reflux uropathy: Secondary | ICD-10-CM

## 2016-08-04 DIAGNOSIS — R350 Frequency of micturition: Secondary | ICD-10-CM | POA: Diagnosis not present

## 2016-08-04 DIAGNOSIS — N401 Enlarged prostate with lower urinary tract symptoms: Secondary | ICD-10-CM

## 2016-08-04 NOTE — Progress Notes (Signed)
9:46 AM   Alejandro Steele May 07, 1946 [id number]  Referring provider: Phineas Realharles Drew [facility] [address] [place], [place] [address]  Chief Complaint  Patient presents with  . Benign Prostatic Hypertrophy    1 month follow up  . Elevated PSA    HPI: Patient is a 70 year old [place] male who presents with Junius Argyleinterpreter,Maria Cole , for a one month follow-up for worsening BPH with LUTS and stopping the Pepsi's and Coca-Cola.        BPH WITH LUTS His IPSS score today is 17 , which is moderate lower urinary tract symptomatology.  He is unhappy with his quality life due to his urinary symptoms.  His previous IPSS score was 22/5.   His previous PVR is 0 mL.  His major complaint today frequency of urination, nocturia  4 and a mild urinary leakage.  He has had these symptoms for several months.  He denies any dysuria, hematuria or suprapubic pain.   He currently taking tamsulosin 0.4 mg and finasteride 5 mg daily.   He also denies any recent fevers, chills, nausea or vomiting.  He does not have a family history of PCa.   He did stop his soda consumption and the urinary symptoms did not improve.  He does continue to drink 2 cups of coffee a day.        IPSS    Row Name 07/03/16 0900 08/04/16 0900       International Prostate Symptom Score   How often have you had the sensation of not emptying your bladder? Almost always Not at All    How often have you had to urinate less than every two hours? More than half the time Almost always    How often have you found you stopped and started again several times when you urinated? About half the time Less than half the time    How often have you found it difficult to postpone urination? Almost always Almost always    How often have you had a weak urinary stream? Less than 1 in 5 times Not at All    How often have you had to strain to start urination? Not at All Not at All    How many times did you typically get up  at night to urinate? 4 Times 5 Times    Total IPSS Score 22 17      Quality of Life due to urinary symptoms   If you were to spend the rest of your life with your urinary condition just the way it is now how would you feel about that? Unhappy Unhappy       Score:  1-7 Mild 8-19 Moderate 20-35 Severe  Erectile dysfunction His SHIM score is 14, which is mild to moderate ED.   His previous SHIM score was 19.  He has been having difficulty with erections for several years.   His major complaint is a slight curvature to his penis.  His libido is preserved.   His risk factors for ED are age, BPH and HTN .  He denies any painful erections.   He has tried Viagra in the past.        SHIM    Row Name 07/03/16 0939         SHIM: Over the last 6 months:   How do you rate your confidence that you could get and keep an erection? Very Low     When you had erections with  sexual stimulation, how often were your erections hard enough for penetration (entering your partner)? Almost Never or Never     During sexual intercourse, how often were you able to maintain your erection after you had penetrated (entered) your partner? Slightly Difficult     During sexual intercourse, how difficult was it to maintain your erection to completion of intercourse? Slightly Difficult     When you attempted sexual intercourse, how often was it satisfactory for you? Slightly Difficult       SHIM Total Score   SHIM 14        Score: 1-7 Severe ED 8-11 Moderate ED 12-16 Mild-Moderate ED 17-21 Mild ED 22-25 No ED   PMH: Past Medical History:  Diagnosis Date  . BPH with obstruction/lower urinary tract symptoms   . Erectile dysfunction   . Gout   . Hemorrhoid   . Hypertension   . Nocturia   . Urinary frequency   . Urinary urgency     Surgical History: Past Surgical History:  Procedure Laterality Date  . COLONOSCOPY      Home Medications:    Medication List       Accurate as of 08/04/16  9:46  AM. Always use your most recent med list.          atenolol 50 MG tablet Commonly known as:  TENORMIN Take 50 mg by mouth daily.   clotrimazole 1 % cream Commonly known as:  LOTRIMIN Apply 1 application topically 2 (two) times daily.   colchicine 0.6 MG tablet Take 0.6 mg by mouth daily.   finasteride 5 MG tablet Commonly known as:  PROSCAR Take 1 tablet (5 mg total) by mouth daily.   HYDROcodone-acetaminophen 5-325 MG tablet Commonly known as:  NORCO/VICODIN Reported on 01/03/2016   indomethacin 25 MG capsule Commonly known as:  INDOCIN Take 25 mg by mouth 2 (two) times daily with a meal. Reported on 01/03/2016   pravastatin 20 MG tablet Commonly known as:  PRAVACHOL   sildenafil 100 MG tablet Commonly known as:  VIAGRA Take 100 mg by mouth daily as needed for erectile dysfunction. Reported on 01/03/2016   tamsulosin 0.4 MG Caps capsule Commonly known as:  FLOMAX Take 1 capsule (0.4 mg total) by mouth daily.       Allergies:  Allergies  Allergen Reactions  . Penicillins     Other reaction(s): Unknown    Family History: Family History  Problem Relation Age of Onset  . Stroke Father   . Diabetes Mellitus II Mother   . Diabetes    . Cancer Neg Hx     Kidney,Prostate,Bladder    Social History:  reports that he has quit smoking. He has never used smokeless tobacco. He reports that he does not drink alcohol or use drugs.  ROS: UROLOGY Frequent Urination?: Yes Hard to postpone urination?: No Burning/pain with urination?: No Get up at night to urinate?: No Leakage of urine?: No Urine stream starts and stops?: No Trouble starting stream?: No Do you have to strain to urinate?: No Blood in urine?: No Urinary tract infection?: No Sexually transmitted disease?: No Injury to kidneys or bladder?: No Painful intercourse?: No Weak stream?: No Erection problems?: No Penile pain?: No  Gastrointestinal Nausea?: No Vomiting?: No Indigestion/heartburn?:  No Diarrhea?: No Constipation?: No  Constitutional Fever: No Night sweats?: No Weight loss?: No Fatigue?: No  Skin Skin rash/lesions?: No Itching?: No  Eyes Blurred vision?: No Double vision?: No  Ears/Nose/Throat Sore throat?: No Sinus problems?: No  Hematologic/Lymphatic Swollen glands?: No Easy bruising?: No  Cardiovascular Leg swelling?: No Chest pain?: No  Respiratory Cough?: No Shortness of breath?: No  Endocrine Excessive thirst?: No  Musculoskeletal Back pain?: No Joint pain?: No  Neurological Headaches?: No Dizziness?: No  Psychologic Depression?: No Anxiety?: No  Physical Exam: BP 135/70   Pulse 60   Wt 198 lb (89.8 kg)   BMI 30.11 kg/m   Constitutional:  Alert and oriented, No acute distress. HEENT: [place] AT, moist mucus membranes.  Trachea midline, no masses. Cardiovascular: No clubbing, cyanosis, or edema. Respiratory: Normal respiratory effort, no increased work of breathing. Skin: No rashes, bruises or suspicious lesions. Lymph: No cervical or inguinal adenopathy. Neurologic: Grossly intact, no focal deficits, moving all 4 extremities. Psychiatric: Normal mood and affect.  Laboratory Data: PSA history  0.7 ng/mL on 06/25/2015  0.5 ng/mL on 06/26/2016   Assessment & Plan:    1. BPH with LUTS  - IPSS score is 17/5, it is stable  - Continue conservative management, avoiding bladder irritants and timed voiding's- specifically giving up Pepsi and Coca-Cola  - Continue tamsulosin 0.4 mg daily and finasteride 5 mg daily  - Discussed medication for OAB and PTNS  - Interested in medication as he travels to [place] frequently and will miss treatments  - will start Myrbetriq 25 mg daily, samples given.  I have advised the patient of the side effects of Myrbetriq, such as: elevation in BP, urinary retention and/or HA.  - RTC in one month for IPSS and PVR  2. Frequency  - see above  3. Nocturia  - see above  - if Myrbetriq  ineffective, will consider sleep study  2. Erectile dysfunction  -SHIM score of 14  - continue Viagra  - not addressed at this visit  3. Peyronie's:   No penile pain or discomfort.  Patient reports same curvature his whole life. We will continue to monitor.   - not addressed at this visit  Return in about 1 month (around 09/03/2016) for IPSS and PVR.  Michiel Cowboy, PA-C  [facility] [address], [address] [place], [place] [address] [phone]

## 2016-09-03 ENCOUNTER — Ambulatory Visit: Payer: Medicare Other | Admitting: Urology

## 2016-09-09 ENCOUNTER — Ambulatory Visit (INDEPENDENT_AMBULATORY_CARE_PROVIDER_SITE_OTHER): Payer: Medicare Other | Admitting: Urology

## 2016-09-09 ENCOUNTER — Encounter: Payer: Self-pay | Admitting: Urology

## 2016-09-09 VITALS — BP 150/62 | HR 68 | Ht 70.0 in | Wt 196.0 lb

## 2016-09-09 DIAGNOSIS — R350 Frequency of micturition: Secondary | ICD-10-CM

## 2016-09-09 DIAGNOSIS — G479 Sleep disorder, unspecified: Secondary | ICD-10-CM | POA: Diagnosis not present

## 2016-09-09 DIAGNOSIS — N138 Other obstructive and reflux uropathy: Secondary | ICD-10-CM | POA: Diagnosis not present

## 2016-09-09 DIAGNOSIS — R351 Nocturia: Secondary | ICD-10-CM | POA: Diagnosis not present

## 2016-09-09 DIAGNOSIS — N401 Enlarged prostate with lower urinary tract symptoms: Secondary | ICD-10-CM

## 2016-09-09 LAB — BLADDER SCAN AMB NON-IMAGING: SCAN RESULT: 0

## 2016-09-09 MED ORDER — MIRABEGRON ER 25 MG PO TB24: 25.0000 mg | ORAL_TABLET | Freq: Every day | ORAL | 4 refills | Status: DC

## 2016-09-09 MED ORDER — TAMSULOSIN HCL 0.4 MG PO CAPS: 0.4000 mg | ORAL_CAPSULE | Freq: Every day | ORAL | 4 refills | Status: DC

## 2016-09-09 MED ORDER — FINASTERIDE 5 MG PO TABS: 5.0000 mg | ORAL_TABLET | Freq: Every day | ORAL | 4 refills | Status: DC

## 2016-09-09 NOTE — Progress Notes (Signed)
10:15 AM   Alejandro Steele [birth year] [id number]  Referring provider: Phineas Realharles Drew [facility] [address] [place], [place] [address]  Chief Complaint  Patient presents with  . Benign Prostatic Hypertrophy    1 month follow up  . Abnormal Penile Curvature    HPI: Patient is a 70 year old [place] male who presents with interpreter, [name] , for a one month follow-up for worsening BPH with LUTS and a trial of Myrbetriq 25 mg daily.   BPH WITH LUTS His IPSS score today is 14 , which is moderate lower urinary tract symptomatology.  He is mostly dissatisfied with his quality life due to his urinary symptoms.  His PVR is 0 mL.  His previous IPSS score was 17/5.     His previous PVR is 0 mL.  His major complaint today frequency of urination, urgency and nocturia  4.  He has had these symptoms for several months.  He denies any dysuria, hematuria or suprapubic pain.   He currently taking tamsulosin 0.4 mg and finasteride 5 mg daily.   He also denies any recent fevers, chills, nausea or vomiting.  He does not have a family history of PCa.   He did stop his soda consumption and the urinary symptoms did not improve.  He does continue to drink 2 cups of coffee a day.  He is also drinking a tea at night to help him sleep which most likely contains a diuretic.  He did note some improvement with the Myrbetriq 25 mg daily.        IPSS    Row Name 08/04/16 0900 09/09/16 0900       International Prostate Symptom Score   How often have you had the sensation of not emptying your bladder? Not at All About half the time    How often have you had to urinate less than every two hours? Almost always Less than half the time    How often have you found you stopped and started again several times when you urinated? Less than half the time About half the time    How often have you found it difficult to postpone urination? Almost always More than half the time    How often have  you had a weak urinary stream? Not at All Not at All    How often have you had to strain to start urination? Not at All Not at All    How many times did you typically get up at night to urinate? 5 Times 2 Times    Total IPSS Score 17 14      Quality of Life due to urinary symptoms   If you were to spend the rest of your life with your urinary condition just the way it is now how would you feel about that? Unhappy Mostly Disatisfied       Score:  1-7 Mild 8-19 Moderate 20-35 Severe    PMH: Past Medical History:  Diagnosis Date  . BPH with obstruction/lower urinary tract symptoms   . Erectile dysfunction   . Gout   . Hemorrhoid   . Hypertension   . Nocturia   . Urinary frequency   . Urinary urgency     Surgical History: Past Surgical History:  Procedure Laterality Date  . COLONOSCOPY      Home Medications:    Medication List       Accurate as of 09/09/16 10:15 AM. Always use your most recent med list.  atenolol 50 MG tablet Commonly known as:  TENORMIN Take 50 mg by mouth daily.   clotrimazole 1 % cream Commonly known as:  LOTRIMIN Apply 1 application topically 2 (two) times daily.   colchicine 0.6 MG tablet Take 0.6 mg by mouth daily.   finasteride 5 MG tablet Commonly known as:  PROSCAR Take 1 tablet (5 mg total) by mouth daily.   HYDROcodone-acetaminophen 5-325 MG tablet Commonly known as:  NORCO/VICODIN Reported on 01/03/2016   indomethacin 25 MG capsule Commonly known as:  INDOCIN Take 25 mg by mouth 2 (two) times daily with a meal. Reported on 01/03/2016   mirabegron ER 25 MG Tb24 tablet Commonly known as:  MYRBETRIQ Take 1 tablet (25 mg total) by mouth daily.   pravastatin 20 MG tablet Commonly known as:  PRAVACHOL   sildenafil 100 MG tablet Commonly known as:  VIAGRA Take 100 mg by mouth daily as needed for erectile dysfunction. Reported on 01/03/2016   tamsulosin 0.4 MG Caps capsule Commonly known as:  FLOMAX Take 1  capsule (0.4 mg total) by mouth daily.       Allergies:  Allergies  Allergen Reactions  . Penicillins     Other reaction(s): Unknown    Family History: Family History  Problem Relation Age of Onset  . Stroke Father   . Diabetes Mellitus II Mother   . Diabetes    . Cancer Neg Hx     Kidney,Prostate,Bladder    Social History:  reports that he has quit smoking. He has never used smokeless tobacco. He reports that he does not drink alcohol or use drugs.  ROS: UROLOGY Frequent Urination?: Yes Hard to postpone urination?: Yes Burning/pain with urination?: No Get up at night to urinate?: Yes Leakage of urine?: No Urine stream starts and stops?: No Trouble starting stream?: No Do you have to strain to urinate?: No Blood in urine?: No Urinary tract infection?: No Sexually transmitted disease?: No Injury to kidneys or bladder?: No Painful intercourse?: No Weak stream?: No Erection problems?: No Penile pain?: No  Gastrointestinal Nausea?: No Vomiting?: No Indigestion/heartburn?: No Diarrhea?: No Constipation?: No  Constitutional Fever: No Night sweats?: No Weight loss?: No Fatigue?: No  Skin Skin rash/lesions?: No Itching?: No  Eyes Blurred vision?: No Double vision?: No  Ears/Nose/Throat Sore throat?: No Sinus problems?: No  Hematologic/Lymphatic Swollen glands?: No Easy bruising?: No  Cardiovascular Leg swelling?: No Chest pain?: No  Respiratory Cough?: No Shortness of breath?: No  Endocrine Excessive thirst?: No  Musculoskeletal Back pain?: No Joint pain?: No  Neurological Headaches?: No Dizziness?: No  Psychologic Depression?: No Anxiety?: No  Physical Exam: BP (!) 150/62   Pulse 68   Ht 5\' 10"  (1.778 m)   Wt 196 lb (88.9 kg)   BMI 28.12 kg/m   Constitutional:  Alert and oriented, No acute distress. HEENT: [place] AT, moist mucus membranes.  Trachea midline, no masses. Cardiovascular: No clubbing, cyanosis, or  edema. Respiratory: Normal respiratory effort, no increased work of breathing. Skin: No rashes, bruises or suspicious lesions. Lymph: No cervical or inguinal adenopathy. Neurologic: Grossly intact, no focal deficits, moving all 4 extremities. Psychiatric: Normal mood and affect.  Laboratory Data: PSA history  0.7 ng/mL on 06/25/2015  0.5 ng/mL on 06/26/2016  Pertinent Imaging Results for Alejandro Steele, Alejandro Steele (MRN [id number]) as of 09/09/2016 09:57  Ref. Range 09/09/2016 09:54  Scan Result Unknown 0     Assessment & Plan:    1. BPH with LUTS  - IPSS score is 14/4,  it is improving  - Continue conservative management, avoiding bladder irritants and timed voiding's- specifically giving up Pepsi and Coca-Cola  - Continue tamsulosin 0.4 mg daily and finasteride 5 mg daily; refills given  - Continue Myrbetriq 25 mg daily; samples and refills given  - RTC in 6 months for IPSS, PVR, PSA and exam  2. Frequency  - see above  3. Nocturia  - see above  4. Difficulty with sleep  - Advised the patient to stop drinking the tea  - Advised the patient to take melatonin 10 mg either in a suspension or disintegrating tablet    Return in about 6 months (around 03/10/2017) for IPSS, PVR, PSA and exam.  Michiel Cowboy, [facility]  [facility] [address], [address] [place], [place] [address] [phone]

## 2016-09-09 NOTE — Patient Instructions (Signed)
Melatonin 10 mg, dissolving tablet and/or liquid

## 2016-09-10 ENCOUNTER — Other Ambulatory Visit: Payer: Self-pay

## 2016-09-10 DIAGNOSIS — N138 Other obstructive and reflux uropathy: Secondary | ICD-10-CM

## 2016-09-10 DIAGNOSIS — N401 Enlarged prostate with lower urinary tract symptoms: Principal | ICD-10-CM

## 2016-09-10 MED ORDER — TAMSULOSIN HCL 0.4 MG PO CAPS: 0.4000 mg | ORAL_CAPSULE | Freq: Every day | ORAL | 4 refills | Status: DC

## 2017-03-03 ENCOUNTER — Other Ambulatory Visit: Payer: Medicare Other

## 2017-03-10 ENCOUNTER — Ambulatory Visit: Payer: Medicare Other | Admitting: Urology

## 2017-05-03 NOTE — Progress Notes (Signed)
10:28 AM   Alejandro Steele 1945-12-15 [id number]  Referring provider: Center, [facility] [address] [place], [place] [address]  Chief Complaint  Patient presents with  . Benign Prostatic Hypertrophy    6 month follow up     HPI: Patient is a 71 year old [place] male who presents with interpreter, [name] , for a 6 month follow-up for BPH with LU TS, frequency and nocturia.     BPH WITH LUTS His IPSS score today is 19 , which is moderate lower urinary tract symptomatology.   He is terrible with his quality life due to his urinary symptoms.  His PVR is 0 mL.  His previous IPSS score was 14/4.  His previous PVR is 0 mL.  His major complaint today frequency of urination, intermittency, a weak urinary stream and nocturia  4.  He has had these symptoms for several months.    He denies any dysuria and hematuria.  He is having suprapubic pain that occurs mostly at night.  He states is mild in nature.  It does not bother him as much during the day.  This has been occurring for months.  He does not have constipation, but he states he does have diarrhea after he drinks his coffee. He currently taking Myrbetriq 25 mg daily, tamsulosin 0.4 mg and finasteride 5 mg daily.   He also denies any recent fevers, chills, nausea or vomiting.  He does not have a family history of PCa.   He did stop his soda consumption for a short period of time and the urinary symptoms did not improve.  He has resumed his soda consumption.  He does continue to drink 2 cups of coffee a day.  He has stopped the tea.          IPSS    Row Name 05/04/17 1000         International Prostate Symptom Score   How often have you had the sensation of not emptying your bladder? Not at All     How often have you had to urinate less than every two hours? Almost always     How often have you found you stopped and started again several times when you urinated? Almost always     How often  have you found it difficult to postpone urination? Not at All     How often have you had a weak urinary stream? Almost always     How often have you had to strain to start urination? Not at All     How many times did you typically get up at night to urinate? 4 Times     Total IPSS Score 19       Quality of Life due to urinary symptoms   If you were to spend the rest of your life with your urinary condition just the way it is now how would you feel about that? Unhappy        Score:  1-7 Mild 8-19 Moderate 20-35 Severe  Frequency Patient is currently on Myrbetriq 25 mg daily.   He has not found any relief with Myrbetriq. He does admit that he has not abstain from soda consumption. He states he is drinking 2 bottles of soda daily and 2 cups of coffee daily.    Nocturia Patient is still experiencing nocturia  4.  He has not found relief with medication.     PMH: Past Medical History:  Diagnosis Date  .  BPH with obstruction/lower urinary tract symptoms   . Erectile dysfunction   . Gout   . Hemorrhoid   . Hypertension   . Nocturia   . Urinary frequency   . Urinary urgency     Surgical History: Past Surgical History:  Procedure Laterality Date  . COLONOSCOPY      Home Medications:  Allergies as of 05/04/2017      Reactions   Penicillins    Other reaction(s): Unknown      Medication List       Accurate as of 05/04/17 10:28 AM. Always use your most recent med list.          atenolol 50 MG tablet Commonly known as:  TENORMIN Take 50 mg by mouth daily.   clotrimazole 1 % cream Commonly known as:  LOTRIMIN Apply 1 application topically 2 (two) times daily.   colchicine 0.6 MG tablet Take 0.6 mg by mouth daily.   finasteride 5 MG tablet Commonly known as:  PROSCAR Take 1 tablet (5 mg total) by mouth daily.   HYDROcodone-acetaminophen 5-325 MG tablet Commonly known as:  NORCO/VICODIN Reported on 01/03/2016   indomethacin 25 MG capsule Commonly known as:   INDOCIN Take 25 mg by mouth 2 (two) times daily with a meal. Reported on 01/03/2016   mirabegron ER 25 MG Tb24 tablet Commonly known as:  MYRBETRIQ Take 1 tablet (25 mg total) by mouth daily.   naproxen 500 MG tablet Commonly known as:  NAPROSYN Take by mouth.   pravastatin 20 MG tablet Commonly known as:  PRAVACHOL   sildenafil 100 MG tablet Commonly known as:  VIAGRA Take 100 mg by mouth daily as needed for erectile dysfunction. Reported on 01/03/2016   tamsulosin 0.4 MG Caps capsule Commonly known as:  FLOMAX Take 1 capsule (0.4 mg total) by mouth daily.       Allergies:  Allergies  Allergen Reactions  . Penicillins     Other reaction(s): Unknown    Family History: Family History  Problem Relation Age of Onset  . Stroke Father   . Diabetes Mellitus II Mother   . Diabetes Unknown   . Cancer Neg Hx        Kidney,Prostate,Bladder    Social History:  reports that he has quit smoking. He has never used smokeless tobacco. He reports that he does not drink alcohol or use drugs.  ROS: UROLOGY Frequent Urination?: Yes Hard to postpone urination?: No Burning/pain with urination?: No Get up at night to urinate?: Yes Leakage of urine?: No Urine stream starts and stops?: No Trouble starting stream?: No Do you have to strain to urinate?: No Blood in urine?: No Urinary tract infection?: No Sexually transmitted disease?: No Injury to kidneys or bladder?: No Painful intercourse?: No Weak stream?: No Erection problems?: No Penile pain?: No  Gastrointestinal Nausea?: No Vomiting?: No Indigestion/heartburn?: No Diarrhea?: No Constipation?: No  Constitutional Fever: No Night sweats?: No Weight loss?: No Fatigue?: No  Skin Skin rash/lesions?: No Itching?: No  Eyes Blurred vision?: No Double vision?: No  Ears/Nose/Throat Sore throat?: No Sinus problems?: No  Hematologic/Lymphatic Swollen glands?: No Easy bruising?: No  Cardiovascular Leg  swelling?: No Chest pain?: No  Respiratory Cough?: No Shortness of breath?: No  Endocrine Excessive thirst?: No  Musculoskeletal Back pain?: No Joint pain?: No  Neurological Headaches?: No Dizziness?: No  Psychologic Depression?: No Anxiety?: No  Physical Exam: BP 139/81   Pulse 62   Ht 5\' 7"  (1.702 m)   Wt 199  lb 9.6 oz (90.5 kg)   BMI 31.26 kg/m   Constitutional: Well nourished. Alert and oriented, No acute distress. HEENT: [place] AT, moist mucus membranes. Trachea midline, no masses. Cardiovascular: No clubbing, cyanosis, or edema. Respiratory: Normal respiratory effort, no increased work of breathing. GI: Abdomen is soft, non tender, non distended, no abdominal masses. Liver and spleen not palpable.  No hernias appreciated.  Stool sample for occult testing is not indicated.   GU: No CVA tenderness.  No bladder fullness or masses.  Patient with uncircumcised phallus.  Foreskin easily retracted  Urethral meatus is patent.  No penile discharge. No penile lesions or rashes. Scrotum without lesions, cysts, rashes and/or edema.  Testicles are located scrotally bilaterally. No masses are appreciated in the testicles. Left and right epididymis are normal. Rectal: Patient with  normal sphincter tone. Anus and perineum without scarring or rashes. No rectal masses are appreciated. Prostate is approximately 55 grams, no nodules are appreciated. Seminal vesicles are normal.  DRE limited to patient's body habitus Skin: No rashes, bruises or suspicious lesions. Lymph: No cervical or inguinal adenopathy. Neurologic: Grossly intact, no focal deficits, moving all 4 extremities. Psychiatric: Normal mood and affect.  Laboratory Data: PSA history  0.7 ng/mL on 06/25/2015  0.5 ng/mL on 06/26/2016  Pertinent Imaging Results for KANOA, PHILLIPPI (MRN [id number]) as of 05/04/2017 10:16  Ref. Range 05/04/2017 10:12  Scan Result Unknown 0      Assessment & Plan:    1. BPH with  LUTS  - IPSS score is 19/6, it is worsening  - Continue conservative management, avoiding bladder irritants and timed voiding's- specifically giving up Pepsi and Coca-[employer]a  - Continue tamsulosin 0.4 mg daily and finasteride 5 mg daily; refills given  - Continue Myrbetriq 25 mg daily; refills given  - Pending PSA results  2. Frequency  - Encouraged to eliminate beverages with caffeine from his diet  - Continue Myrbetriq 25 mg daily pending cystoscopic findings  3. Nocturia  - see above   4. Suprapubic pain  - It may be likely that his suprapubic pain as result of his diet, but I would like him to undergo cystoscopy at this time as he has not been responsive to medications to rule out CIS  - I have explained to the patient that they will  be scheduled for a cystoscopy in our office to evaluate their bladder.  The cystoscopy consists of passing a tube with a lens up through their urethra and into their urinary bladder.   We will inject the urethra with a lidocaine gel prior to introducing the cystoscope to help with any discomfort during the procedure.   After the procedure, they might experience blood in the urine and discomfort with urination.  This will abate after the first few voids.  I have  encouraged the patient to increase water intake  during this time.  Patient denies any allergies to lidocaine.   - patient's questions are answered and he agrees to the procedure  Return for CYSTOSCOPY TO RULE OUT CIS.  Michiel Cowboy, PA-C  [facility] [address] [place], [place] [address] [phone]

## 2017-05-04 ENCOUNTER — Encounter: Payer: Self-pay | Admitting: Urology

## 2017-05-04 ENCOUNTER — Ambulatory Visit (INDEPENDENT_AMBULATORY_CARE_PROVIDER_SITE_OTHER): Payer: Medicare Other | Admitting: Urology

## 2017-05-04 VITALS — BP 139/81 | HR 62 | Ht 67.0 in | Wt 199.6 lb

## 2017-05-04 DIAGNOSIS — R351 Nocturia: Secondary | ICD-10-CM | POA: Diagnosis not present

## 2017-05-04 DIAGNOSIS — R350 Frequency of micturition: Secondary | ICD-10-CM

## 2017-05-04 DIAGNOSIS — R102 Pelvic and perineal pain: Secondary | ICD-10-CM | POA: Diagnosis not present

## 2017-05-04 DIAGNOSIS — N401 Enlarged prostate with lower urinary tract symptoms: Secondary | ICD-10-CM

## 2017-05-04 DIAGNOSIS — N138 Other obstructive and reflux uropathy: Secondary | ICD-10-CM | POA: Diagnosis not present

## 2017-05-04 LAB — BLADDER SCAN AMB NON-IMAGING: SCAN RESULT: 0

## 2017-05-04 MED ORDER — TAMSULOSIN HCL 0.4 MG PO CAPS: 0.4000 mg | ORAL_CAPSULE | Freq: Every day | ORAL | 4 refills | Status: DC

## 2017-05-04 MED ORDER — MIRABEGRON ER 25 MG PO TB24: 25.0000 mg | ORAL_TABLET | Freq: Every day | ORAL | 4 refills | Status: DC

## 2017-05-04 MED ORDER — FINASTERIDE 5 MG PO TABS: 5.0000 mg | ORAL_TABLET | Freq: Every day | ORAL | 4 refills | Status: DC

## 2017-05-05 ENCOUNTER — Telehealth: Payer: Self-pay

## 2017-05-05 LAB — PSA: Prostate Specific Ag, Serum: 0.5 ng/mL (ref 0.0–4.0)

## 2017-05-05 NOTE — Telephone Encounter (Signed)
Interpreter [phone]#[id number]  Patient notified of results through interpreter line. Patient also had questions about upcoming cysto which were answered

## 2017-05-05 NOTE — Telephone Encounter (Signed)
-----   Message from Harle BattiestShannon A McGowan, PA-C sent at 05/05/2017  8:39 AM EDT ----- Please tell [name] that his PSA is stable at 0.5.

## 2017-05-27 ENCOUNTER — Ambulatory Visit (INDEPENDENT_AMBULATORY_CARE_PROVIDER_SITE_OTHER): Payer: Medicare Other | Admitting: Urology

## 2017-05-27 ENCOUNTER — Encounter: Payer: Self-pay | Admitting: Urology

## 2017-05-27 VITALS — BP 121/66 | HR 75 | Ht 67.0 in | Wt 200.3 lb

## 2017-05-27 DIAGNOSIS — N3281 Overactive bladder: Secondary | ICD-10-CM

## 2017-05-27 DIAGNOSIS — N401 Enlarged prostate with lower urinary tract symptoms: Secondary | ICD-10-CM

## 2017-05-27 DIAGNOSIS — N138 Other obstructive and reflux uropathy: Secondary | ICD-10-CM

## 2017-05-27 LAB — URINALYSIS, COMPLETE
Bilirubin, UA: NEGATIVE
Glucose, UA: NEGATIVE
KETONES UA: NEGATIVE
Leukocytes, UA: NEGATIVE
NITRITE UA: NEGATIVE
SPEC GRAV UA: 1.02 (ref 1.005–1.030)
UUROB: 1 mg/dL (ref 0.2–1.0)
pH, UA: 6.5 (ref 5.0–7.5)

## 2017-05-27 LAB — MICROSCOPIC EXAMINATION
Bacteria, UA: NONE SEEN
EPITHELIAL CELLS (NON RENAL): NONE SEEN /HPF (ref 0–10)
WBC UA: NONE SEEN /HPF (ref 0–?)

## 2017-05-27 MED ORDER — LIDOCAINE HCL 2 % EX GEL
1.0000 "application " | Freq: Once | CUTANEOUS | Status: AC
  Administered 2017-05-27: 1 via URETHRAL

## 2017-05-27 MED ORDER — CIPROFLOXACIN HCL 500 MG PO TABS
500.0000 mg | ORAL_TABLET | Freq: Once | ORAL | Status: AC
  Administered 2017-05-27: 500 mg via ORAL

## 2017-05-27 NOTE — Progress Notes (Signed)
   05/27/17  CC:  Chief Complaint  Patient presents with  . Cysto    HPI:  Patient is a 71 year old [place] male who presents for cystoscopy. He presents today for cystoscopy due to significant urinary symptoms refractory to multiple medications.  BPH WITH LUTS His IPSS score today is 19 , which is moderate lower urinary tract symptomatology.   He has a terrible quality life due to his urinary symptoms.  His PVR is 0 mL.  His previous IPSS score was 14/4.  His previous PVR is 0 mL.  His major complaint today frequency of urination, intermittency, a weak urinary stream and nocturia  4.  He has had these symptoms for several months.    He denies any dysuria and hematuria.  He is having suprapubic pain that occurs mostly at night.  He states is mild in nature.  It does not bother him as much during the day.  This has been occurring for months.  He does not have constipation, but he states he does have diarrhea after he drinks his coffee. He currently taking Myrbetriq 25 mg daily, tamsulosin 0.4 mg and finasteride 5 mg daily.   He also denies any recent fevers, chills, nausea or vomiting.  He does not have a family history of PCa.   He did stop his soda consumption for a short period of time and the urinary symptoms did not improve.  He has resumed his soda consumption.  He does continue to drink 2 cups of coffee a day.  He has stopped the tea.    There were no vitals taken for this visit. NED. A&Ox3.   No respiratory distress   Abd soft, NT, ND Normal phallus with bilateral descended testicles  Cystoscopy Procedure Note  Patient identification was confirmed, informed consent was obtained, and patient was prepped using Betadine solution.  Lidocaine jelly was administered per urethral meatus.    Preoperative abx where received prior to procedure.     Pre-Procedure: - Inspection reveals a normal caliber ureteral meatus.  Procedure: The flexible cystoscope was introduced without  difficulty - No urethral strictures/lesions are present. - Enlarged prostate Visually obstructive - approximately 5 cm - Normal bladder neck - Bilateral ureteral orifices identified - Bladder mucosa  reveals no ulcers, tumors, or lesions - No bladder stones - No trabeculation  Retroflexion shows no intravesical lobe   Post-Procedure: - Patient tolerated the procedure well  Assessment/ Plan:  1. BPH 2. OAB -continue myrbetriq, flomax, and finasteride -Recommend avoiding soda as this seems to worsen his symptoms -We'll reassess patient in 3 months. If he continues to have significant urinary implants are controlled by medication, we will consider urodynamic studies at that time.

## 2017-08-27 ENCOUNTER — Ambulatory Visit (INDEPENDENT_AMBULATORY_CARE_PROVIDER_SITE_OTHER): Payer: Medicare Other | Admitting: Urology

## 2017-08-27 ENCOUNTER — Telehealth: Payer: Self-pay | Admitting: Urology

## 2017-08-27 VITALS — BP 143/81 | HR 54 | Ht 67.0 in | Wt 201.0 lb

## 2017-08-27 DIAGNOSIS — N138 Other obstructive and reflux uropathy: Secondary | ICD-10-CM | POA: Diagnosis not present

## 2017-08-27 DIAGNOSIS — N3281 Overactive bladder: Secondary | ICD-10-CM | POA: Diagnosis not present

## 2017-08-27 DIAGNOSIS — N401 Enlarged prostate with lower urinary tract symptoms: Secondary | ICD-10-CM

## 2017-08-27 LAB — URINALYSIS, COMPLETE
Bilirubin, UA: NEGATIVE
GLUCOSE, UA: NEGATIVE
KETONES UA: NEGATIVE
Leukocytes, UA: NEGATIVE
Nitrite, UA: NEGATIVE
Protein, UA: NEGATIVE
Specific Gravity, UA: 1.015 (ref 1.005–1.030)
UUROB: 0.2 mg/dL (ref 0.2–1.0)
pH, UA: 7.5 (ref 5.0–7.5)

## 2017-08-27 MED ORDER — FESOTERODINE FUMARATE ER 4 MG PO TB24: 4.0000 mg | ORAL_TABLET | Freq: Every day | ORAL | 11 refills | Status: DC

## 2017-08-27 NOTE — Progress Notes (Signed)
08/27/2017 9:35 AM   Alejandro Steele 1946/11/10 [id number]  Referring provider: Center, [facility] Real [facility] [address] [name], [place] [address]  Chief Complaint  Patient presents with  . Benign Prostatic Hypertrophy    HPI: Patient is a 71 year old [place] male who presents for follow up of significant urinary symptoms refractory to multiple medications. He is currently on Flomax, finasteride, and Myrbetriq. He continues to have significant issues with quality of life. His I PSS score is 28/6. His quality of life is rated as terrible. He has significant feeling of incomplete emptying, frequency, intermittency, and urgency. He has nocturia 5. His knowledge moderate weak stream. After being started on her better, he has noted no improvement in his symptoms. He is most bothered by his low volume frequent voids that occur more than once per hour. He also has urge incontinence which he finds bothersome. He is overall quite miserable.  BPH WITH LUTS - Background History: His IPSS score today is 19, which is moderate lower urinary tract symptomatology. He has a terriblequality life due to his urinary symptoms. His PVR is 0 mL. His previous IPSS score was 14/4. His previous PVR is 0 mL. His major complaint today frequency of urination, intermittency, a weak urinary streamand nocturia  4. He has had these symptoms for several months.   He denies any dysuria and hematuria. He is having suprapubic pain that occurs mostly at night. He states is mild in nature. It does not bother him as much during the day. This has been occurring for months. He does not have constipation, but he states he does have diarrhea after he drinks his coffee.He currently taking Myrbetriq 25 mg daily, tamsulosin 0.4 mg and finasteride 5 mg daily. He also denies any recent fevers, chills, nausea or vomiting. He does not have a family history of PCa.   He did stop his soda  consumption for a short period of time and the urinary symptoms did not improve. He has resumed his soda consumption. He does continue to drink 2 cups of coffee a day. He has stopped the tea.     Cystoscopy (05/2017): -Visually Obstructive approximately 5 cm prostate   PMH: Past Medical History:  Diagnosis Date  . BPH with obstruction/lower urinary tract symptoms   . Erectile dysfunction   . Gout   . Hemorrhoid   . Hypertension   . Nocturia   . Urinary frequency   . Urinary urgency     Surgical History: Past Surgical History:  Procedure Laterality Date  . COLONOSCOPY      Home Medications:  Allergies as of 08/27/2017      Reactions   Penicillins    Other reaction(s): Unknown      Medication List       Accurate as of 08/27/17  9:35 AM. Always use your most recent med list.          atenolol 50 MG tablet Commonly known as:  TENORMIN Take 50 mg by mouth daily.   finasteride 5 MG tablet Commonly known as:  PROSCAR Take 1 tablet (5 mg total) by mouth daily.   mirabegron ER 25 MG Tb24 tablet Commonly known as:  MYRBETRIQ Take 1 tablet (25 mg total) by mouth daily.   pravastatin 20 MG tablet Commonly known as:  PRAVACHOL   tamsulosin 0.4 MG Caps capsule Commonly known as:  FLOMAX Take 1 capsule (0.4 mg total) by mouth daily.       Allergies:  Allergies  Allergen Reactions  . Penicillins     Other reaction(s): Unknown    Family History: Family History  Problem Relation Age of Onset  . Stroke Father   . Diabetes Mellitus II Mother   . Diabetes Unknown   . Cancer Neg Hx        Kidney,Prostate,Bladder    Social History:  reports that he has quit smoking. He has never used smokeless tobacco. He reports that he does not drink alcohol or use drugs.  ROS: UROLOGY Frequent Urination?: Yes Hard to postpone urination?: Yes Burning/pain with urination?: No Get up at night to urinate?: Yes Leakage of urine?: Yes Urine stream starts and stops?:  No Trouble starting stream?: No Do you have to strain to urinate?: No Blood in urine?: No Urinary tract infection?: No Sexually transmitted disease?: No Injury to kidneys or bladder?: No Painful intercourse?: No Weak stream?: No Erection problems?: No Penile pain?: No  Gastrointestinal Nausea?: No Vomiting?: No Indigestion/heartburn?: No Diarrhea?: No Constipation?: No  Constitutional Fever: No Night sweats?: No Weight loss?: No Fatigue?: No  Skin Skin rash/lesions?: No Itching?: No  Eyes Blurred vision?: No Double vision?: No  Ears/Nose/Throat Sore throat?: No Sinus problems?: No  Hematologic/Lymphatic Swollen glands?: No Easy bruising?: No  Cardiovascular Leg swelling?: No Chest pain?: No  Respiratory Cough?: No Shortness of breath?: No  Endocrine Excessive thirst?: No  Musculoskeletal Back pain?: No Joint pain?: No  Neurological Headaches?: No Dizziness?: No  Psychologic Depression?: No Anxiety?: No  Physical Exam: BP (!) 143/81   Pulse (!) 54   Ht  (1.702 m)   Wt 201 lb (91.2 kg)   BMI 31.48 kg/m   Constitutional:  Alert and oriented, No acute distress. HEENT: [place] AT, moist mucus membranes.  Trachea midline, no masses. Cardiovascular: No clubbing, cyanosis, or edema. Respiratory: Normal respiratory effort, no increased work of breathing. GI: Abdomen is soft, nontender, nondistended, no abdominal masses GU: No CVA tenderness.  Skin: No rashes, bruises or suspicious lesions. Lymph: No cervical or inguinal adenopathy. Neurologic: Grossly intact, no focal deficits, moving all 4 extremities. Psychiatric: Normal mood and affect.  Laboratory Data: No results found for: WBC, HGB, HCT, MCV, PLT  No results found for: CREATININE  No results found for: PSA  No results found for: TESTOSTERONE  No results found for: HGBA1C  Urinalysis    Component Value Date/Time   APPEARANCEUR Clear 05/27/2017 1111   GLUCOSEU Negative  05/27/2017 1111   BILIRUBINUR Negative 05/27/2017 1111   PROTEINUR Trace (A) 05/27/2017 1111   NITRITE Negative 05/27/2017 1111   LEUKOCYTESUR Negative 05/27/2017 1111    Assessment & Plan:    1. BPH 2. OAB -continue flomax and finasteride -Will add Toviaz. Stop Myrbetrq.  -We'll reassess patient in 3 months when he returns from the [place]. If he continues to have significant urinary complaints that are not controlled by medication, we will obtain urodynamic studies at that time.   Return in about 3 months (around 11/27/2017).  Hildred Laser, MD  [facility] [address], [address] [place], [place] [address] [phone]

## 2017-08-27 NOTE — Telephone Encounter (Signed)
Pt did not schedule 3 mo follow up at check out, pt is going to [place] and not sure when he'll return. States he will call to schedule appt when he returns. FYI  °

## 2018-02-12 ENCOUNTER — Ambulatory Visit (INDEPENDENT_AMBULATORY_CARE_PROVIDER_SITE_OTHER): Payer: Medicare Other | Admitting: Urology

## 2018-02-12 ENCOUNTER — Encounter: Payer: Self-pay | Admitting: Urology

## 2018-02-12 VITALS — BP 151/82 | HR 61 | Ht 70.0 in | Wt 203.0 lb

## 2018-02-12 DIAGNOSIS — N400 Enlarged prostate without lower urinary tract symptoms: Secondary | ICD-10-CM

## 2018-02-12 DIAGNOSIS — N3281 Overactive bladder: Secondary | ICD-10-CM | POA: Diagnosis not present

## 2018-02-12 NOTE — Progress Notes (Signed)
02/12/2018 11:01 AM   Alejandro Steele Jul 21, 1946 [id number]  Referring provider: [facility] [address] [place], [place] [address]  No chief complaint on file.   HPI: The patient is a 72 year old [place] male who presents for follow up of significant urinary symptoms refractory to multiple medications. He is currently on Flomax, finasteride, and Toviaz.  At his last visit, the Gala Murdoch was added and Myrbetriq  was stopped at it did not help his significant urinary symptoms.  He notes currently that he has a 20% improvement in his symptoms.  He is still very bothered by them, particularly his urinary urgency.  He also has daytime frequency.  He does still have low volume frequent voids sometimes more than once per hour.  He continues to have occasional urge incontinence.  He has intermittency as well as a weak stream.  He does feel he does not empty his bladder.  He did stop his soda consumption for a short period of time and the urinary symptoms did not improve. He has resumed his soda consumption. He does continue to drink 2 cups of coffee a day. He has stopped the tea.     Cystoscopy (05/2017): -Visually Obstructive approximately 5 cm prostate       PMH: Past Medical History:  Diagnosis Date  . BPH with obstruction/lower urinary tract symptoms   . Erectile dysfunction   . Gout   . Hemorrhoid   . Hypertension   . Nocturia   . Urinary frequency   . Urinary urgency     Surgical History: Past Surgical History:  Procedure Laterality Date  . COLONOSCOPY      Home Medications:  Allergies as of 02/12/2018      Reactions   Penicillins    Other reaction(s): Unknown      Medication List        Accurate as of 02/12/18 11:01 AM. Always use your most recent med list.          atenolol 50 MG tablet Commonly known as:  TENORMIN Take 50 mg by mouth daily.   fesoterodine 4 MG Tb24 tablet Commonly known as:   TOVIAZ Take 1 tablet (4 mg total) by mouth daily.   finasteride 5 MG tablet Commonly known as:  PROSCAR Take 1 tablet (5 mg total) by mouth daily.   pravastatin 20 MG tablet Commonly known as:  PRAVACHOL   ranitidine 150 MG tablet Commonly known as:  ZANTAC TK 1 T PO BID   tamsulosin 0.4 MG Caps capsule Commonly known as:  FLOMAX Take 1 capsule (0.4 mg total) by mouth daily.       Allergies:  Allergies  Allergen Reactions  . Penicillins     Other reaction(s): Unknown    Family History: Family History  Problem Relation Age of Onset  . Stroke Father   . Diabetes Mellitus II Mother   . Diabetes Unknown   . Cancer Neg Hx        Kidney,Prostate,Bladder    Social History:  reports that he has quit smoking. He has never used smokeless tobacco. He reports that he does not drink alcohol or use drugs.  ROS: UROLOGY Frequent Urination?: Yes Hard to postpone urination?: No Burning/pain with urination?: No Get up at night to urinate?: No Leakage of urine?: No Urine stream starts and stops?: No Trouble starting stream?: No Do you have to strain to urinate?: No Blood in urine?: No Urinary tract infection?: No Sexually transmitted disease?:  No Injury to kidneys or bladder?: No Painful intercourse?: No Weak stream?: No Erection problems?: No Penile pain?: No  Gastrointestinal Nausea?: No Vomiting?: No Indigestion/heartburn?: No Diarrhea?: No Constipation?: No  Constitutional Fever: No Night sweats?: No Weight loss?: No Fatigue?: No  Skin Skin rash/lesions?: No Itching?: No  Eyes Blurred vision?: No Double vision?: No  Ears/Nose/Throat Sore throat?: No Sinus problems?: No  Hematologic/Lymphatic Swollen glands?: No Easy bruising?: No  Cardiovascular Leg swelling?: No Chest pain?: No  Respiratory Cough?: No Shortness of breath?: No  Endocrine Excessive thirst?: No  Musculoskeletal Back pain?: No Joint pain?:  No  Neurological Headaches?: No Dizziness?: No  Psychologic Depression?: No Anxiety?: No  Physical Exam: BP (!) 151/82   Pulse 61   Ht 5\' 10"  (1.778 m)   Wt 203 lb (92.1 kg)   BMI 29.13 kg/m   Constitutional:  Alert and oriented, No acute distress. HEENT: [place] AT, moist mucus membranes.  Trachea midline, no masses. Cardiovascular: No clubbing, cyanosis, or edema. Respiratory: Normal respiratory effort, no increased work of breathing. GI: Abdomen is soft, nontender, nondistended, no abdominal masses GU: No CVA tenderness.  Skin: No rashes, bruises or suspicious lesions. Lymph: No cervical or inguinal adenopathy. Neurologic: Grossly intact, no focal deficits, moving all 4 extremities. Psychiatric: Normal mood and affect.  Laboratory Data: No results found for: WBC, HGB, HCT, MCV, PLT  No results found for: CREATININE  No results found for: PSA  No results found for: TESTOSTERONE  No results found for: HGBA1C  Urinalysis    Component Value Date/Time   APPEARANCEUR Clear 08/27/2017 0921   GLUCOSEU Negative 08/27/2017 0921   BILIRUBINUR Negative 08/27/2017 0921   PROTEINUR Negative 08/27/2017 0921   NITRITE Negative 08/27/2017 0921   LEUKOCYTESUR Negative 08/27/2017 0921    Assessment & Plan:    1. BPH 2. OAB -continue flomax, finasteride, and toviaz at this time -Due to his mixture of urinary symptoms, we will have him undergo urodynamic studies to better evaluate his lower urinary tract function.  He will follow-up after records for results.   Return for after UDS.  Hildred LaserBrian James Yuka Lallier, MD  [facility] [address], [address] [place], [place] [id number] [phone]

## 2018-03-12 ENCOUNTER — Ambulatory Visit: Payer: Medicare Other

## 2018-05-25 ENCOUNTER — Other Ambulatory Visit: Payer: Self-pay

## 2018-05-25 DIAGNOSIS — N401 Enlarged prostate with lower urinary tract symptoms: Principal | ICD-10-CM

## 2018-05-25 DIAGNOSIS — N138 Other obstructive and reflux uropathy: Secondary | ICD-10-CM

## 2018-05-25 MED ORDER — FINASTERIDE 5 MG PO TABS: 5.0000 mg | ORAL_TABLET | Freq: Every day | ORAL | 1 refills | Status: DC

## 2018-05-25 MED ORDER — TAMSULOSIN HCL 0.4 MG PO CAPS: 0.4000 mg | ORAL_CAPSULE | Freq: Every day | ORAL | 1 refills | Status: DC

## 2018-07-27 ENCOUNTER — Encounter: Payer: Self-pay | Admitting: Urology

## 2018-07-27 ENCOUNTER — Ambulatory Visit (INDEPENDENT_AMBULATORY_CARE_PROVIDER_SITE_OTHER): Payer: Medicare Other | Admitting: Urology

## 2018-07-27 VITALS — BP 150/74 | HR 65 | Ht 70.0 in | Wt 201.8 lb

## 2018-07-27 DIAGNOSIS — N138 Other obstructive and reflux uropathy: Secondary | ICD-10-CM | POA: Diagnosis not present

## 2018-07-27 DIAGNOSIS — N401 Enlarged prostate with lower urinary tract symptoms: Secondary | ICD-10-CM

## 2018-07-27 MED ORDER — TAMSULOSIN HCL 0.4 MG PO CAPS: 0.4000 mg | ORAL_CAPSULE | Freq: Every day | ORAL | 3 refills | Status: DC

## 2018-07-27 MED ORDER — FESOTERODINE FUMARATE ER 4 MG PO TB24: 4.0000 mg | ORAL_TABLET | Freq: Every day | ORAL | 3 refills | Status: DC

## 2018-07-27 MED ORDER — FINASTERIDE 5 MG PO TABS: 5.0000 mg | ORAL_TABLET | Freq: Every day | ORAL | 3 refills | Status: DC

## 2018-07-27 NOTE — Addendum Note (Signed)
Addended by: Honor Loh on: 07/27/2018 01:50 PM   Modules accepted: Orders

## 2018-07-27 NOTE — Progress Notes (Addendum)
UROLOGY PROGRESS NOTE  I had the pleasure of seeing [name] in urology clinic today for follow-up of BPH and mixed urinary tract symptoms.  Today's visit was conducted via Nurse, learning disability.  He was previously followed by Dr. Sherryl Barters and was on maximal medical therapy with Flomax, finasteride, and Toviaz.  Dr. Sherryl Barters had recommended urodynamics however the patient did not follow-up to have these done.  He does feel his symptoms have improved with this combination of medications.  His symptoms are mixed, and include urgency, frequency, weak stream, feeling of incomplete emptying.  He has had a cystoscopy previously which showed a large, visually obstructive approximately 5 cm prostate.  There is no cross-sectional imaging to estimate prostate volume.  PSA was 0.5 in June 2018, corrected for finasteride is 1.0.  We had a long conversation about his urinary symptoms today and the importance of obtaining urodynamics to better identify if these are storage or voiding driven.  He is in agreement to obtain these in [place].  We will see him back after these are performed to discuss options moving forward.  Greater than 15 minutes were spent in direct patient counseling and education regarding BPH/LUTS as well as urodynamics.  Sondra Come, MD 07/27/2018

## 2018-08-17 ENCOUNTER — Other Ambulatory Visit: Payer: Self-pay | Admitting: Urology

## 2018-08-17 NOTE — Progress Notes (Signed)
Please move up his follow up with me to my next available RTC since he has already completed urodynamics, thanks  Legrand Rams, MD 08/17/2018

## 2018-08-18 ENCOUNTER — Telehealth: Payer: Self-pay | Admitting: Urology

## 2018-08-18 NOTE — Telephone Encounter (Signed)
-----   Message from Brian C Sninsky, MD sent at 08/17/2018  5:41 PM EDT ----- ° ° °----- Message ----- °From: Gentry, Angela D °Sent: 08/17/2018   5:00 PM EDT °To: Brian C Sninsky, MD ° °

## 2018-08-18 NOTE — Telephone Encounter (Signed)
-----   Message from Sondra Come, MD sent at 08/17/2018  5:41 PM EDT -----   ----- Message ----- From: Marlaine Hind D Sent: 08/17/2018   5:00 PM EDT To: Sondra Come, MD

## 2018-08-18 NOTE — Telephone Encounter (Signed)
I am trying to reach the patient but he does not have a voicemail I will keep trying  Alejandro Steele

## 2018-08-18 NOTE — Telephone Encounter (Signed)
I spoke with the patient and have him scheduled for 09-14-18  MB

## 2018-09-14 ENCOUNTER — Other Ambulatory Visit: Payer: Self-pay

## 2018-09-14 ENCOUNTER — Encounter: Payer: Self-pay | Admitting: Urology

## 2018-09-14 ENCOUNTER — Ambulatory Visit (INDEPENDENT_AMBULATORY_CARE_PROVIDER_SITE_OTHER): Payer: Medicare Other | Admitting: Urology

## 2018-09-14 VITALS — BP 111/65 | HR 58 | Ht 69.0 in | Wt 200.1 lb

## 2018-09-14 DIAGNOSIS — N3281 Overactive bladder: Secondary | ICD-10-CM | POA: Diagnosis not present

## 2018-09-14 MED ORDER — TOLTERODINE TARTRATE ER 4 MG PO CP24: 4.0000 mg | ORAL_CAPSULE | Freq: Every day | ORAL | 3 refills | Status: DC

## 2018-09-14 NOTE — Progress Notes (Signed)
   09/14/2018 12:42 PM   Alejandro Steele 08/16/1946 [id number]  Reason for visit: Follow up mixed urinary symptoms  HPI: I had the pleasure of seeing Alejandro Steele back in urology clinic to discuss his severe urinary symptoms.  Today's visit was conducted via a Engineer, structural.  He was previously followed by Dr. Sherryl Barters.  His primary symptoms are urgency and frequency, with some urge incontinence.  He has been on maximal medical therapy with Toviaz, Flomax, and finasteride.  He underwent urodynamics in [place] on 08/16/2018.  Urodynamics showed a maximum capacity bladder of only 100 cc, with detrusor instability/overactivity and uncontrolled involuntary contractions.  Max flow was 7 mL/S, with detrusor pressure at peak flow of 19 cm H2O.  He does drink quite a bit of coffee and soda during the day.  He has tried Myrbetriq previously with minimal improvement.  Assessment & Plan:   On reviewing his urodynamics, I think his primary issue is a small capacity bladder with detrusor instability consistent with an overactive bladder.  I would be hesitant to perform any surgical outlet procedures with his severe overactive symptoms.   We discussed that overactive bladder (OAB) is not a disease, but is a symptom complex that is generally not life-threatening.  Symptoms typically include urinary urgency, frequency, and urge incontinence.  There are numerous treatment options, however there are risks and benefits with both medical and surgical management.  First-line treatment is behavioral therapies including bladder training, pelvic floor muscle training, and fluid management.  Second line treatments include oral antimuscarinics(Ditropan er, Trospium) and beta-3 agonist (Mybetriq). There is typically a period of medication trial (4-8 weeks) to find the optimal therapy and dosing. If symptoms are bothersome despite the above management, third line options include intra-detrusor botox,  peripheral tibial nerve stimulation (PTNS), and interstim (SNS). These are more invasive treatments with higher side effect profile, but may improve quality of life for patients with severe OAB symptoms.   Continue Flomax and finasteride Change Toviaz to Detrol LA 4 mg Referral to pelvic floor physical therapy Follow-up in 5 months when he returns from [place] to discuss his symptoms.  Other options at that time include alternative anticholinergic, 8 mg dose of Toviaz, or intradetrusor Botox.  Please note that greater than 15 minutes were spent with the patient today, greater than 50% were spent in direct patient education and counseling regarding overactive bladder, urgency, frequency, and medication adjustments.  Sondra Come, MD  [facility] [address] [place], [place] [address] [phone]

## 2018-09-15 ENCOUNTER — Telehealth: Payer: Self-pay | Admitting: Urology

## 2018-09-15 MED ORDER — TOLTERODINE TARTRATE ER 4 MG PO CP24: 4.0000 mg | ORAL_CAPSULE | Freq: Every day | ORAL | 3 refills | Status: DC

## 2018-09-15 NOTE — Addendum Note (Signed)
Addended by: Martha Clan on: 09/15/2018 03:56 PM   Modules accepted: Orders

## 2018-09-15 NOTE — Telephone Encounter (Signed)
Pt called office stating he was seen yesterday by Dr. Aleene Davidson, states Dr. Richardo Hanks started him on a "new" medication but it wasn't sent to his pharmacy.  Pt doesn't remember the name of the new Rx. Pt wants asking about his new Rx. Please advise pt at [phone]

## 2018-10-26 ENCOUNTER — Ambulatory Visit: Payer: Medicare Other | Admitting: Urology

## 2018-11-03 ENCOUNTER — Ambulatory Visit: Payer: Medicare Other | Admitting: Physical Therapy

## 2019-01-26 ENCOUNTER — Ambulatory Visit (INDEPENDENT_AMBULATORY_CARE_PROVIDER_SITE_OTHER): Payer: Medicare Other | Admitting: Urology

## 2019-01-26 ENCOUNTER — Other Ambulatory Visit: Payer: Self-pay

## 2019-01-26 ENCOUNTER — Encounter: Payer: Self-pay | Admitting: Urology

## 2019-01-26 VITALS — BP 123/73 | HR 70 | Ht 69.0 in | Wt 200.0 lb

## 2019-01-26 DIAGNOSIS — N3281 Overactive bladder: Secondary | ICD-10-CM

## 2019-01-26 MED ORDER — TOLTERODINE TARTRATE ER 4 MG PO CP24: 4.0000 mg | ORAL_CAPSULE | Freq: Every day | ORAL | 11 refills | Status: DC

## 2019-01-26 NOTE — Progress Notes (Signed)
   01/26/2019 10:44 AM   Alejandro Steele 02-08-1946 [id number]  Reason for visit: Follow up OAB  HPI: I saw Alejandro Steele back in urology clinic to discuss his overactive bladder symptoms.  Today's visit was conducted via a Engineer, structural.  To briefly summarize, he is a 73 year old male with a long history of overactive symptoms primarily nocturia, urgency, and frequency.  He has been trialed on numerous medications including Toviaz and Myrbetriq with minimal improvement.  He is currently on finasteride and Detrol 4 mg XL which is been a good combination for him.  He reports this is resolved his urgency incontinence, however he continues to have nocturia 2-3 times per night and bothersome frequency during the day.  Urodynamics fall 2019 showed a maximum capacity bladder of only 100 cc, with detrusor instability/overactivity and uncontrolled involuntary contractions.  Max flow was 7 mL/S, with detrusor pressure at peak flow of 19 cm H2O. He has also undergone negative cystoscopy previously with Dr. Sherryl Barters that showed a large prostate but no stricture or bladder lesions.  He previously consumed large amounts of soda and coffee, but has minimized these in his diet with additional improvement in his urinary symptoms.   We discussed at length options moving forward including PTNS and Botox.  We discussed the risks and benefits at length.  Transportation is an issue for him, but he is interested in pursuing PTNS.  He will call the office to set this up in the near future.  Continue Detrol 4 mg XL and finasteride Follow-up for PTNS  A total of 15 minutes were spent face-to-face with the patient, greater than 50% was spent in patient education, counseling, and coordination of care regarding overactive bladder and treatment strategies.   Sondra Come, MD  [facility] [address] [place], [place] [address] [phone]

## 2019-01-26 NOTE — Patient Instructions (Signed)
Vejiga hiperactiva en adultos  Overactive Bladder, Adult    Vejiga hiperactiva se refiere a una afeccin en la que la persona tiene una necesidad sbita de orinar. La persona puede tener una prdida de orina si no puede llegar al bao con la rapidez suficiente (incontinencia urinaria). Una persona con esta afeccin tambin puede despertarse varias veces durante la noche para ir al bao.  La vejiga hiperactiva est asociada con seales nerviosas deficientes entre la vejiga y el cerebro. La vejiga puede recibir la seal de vaciarse antes de que est llena. Usted tambin puede tener msculos muy sensibles que hacen que la vejiga se contraiga demasiado pronto. Estos sntomas pueden interferir en el trabajo diario o las actividades sociales.  Cules son las causas?  Esta afeccin puede estar relacionada con, o ser provocada por:   Infeccin de las vas urinarias.   Infeccin de los tejidos cercanos, como la prstata.   Agrandamiento de la prstata.   Ciruga en el tero o la uretra.   Clculos en la vejiga, inflamacin o tumores.   Consumir cafena o alcohol en exceso.   Ciertos medicamentos, especialmente los medicamentos para eliminar el exceso de lquido del cuerpo (diurticos).   Debilidad de los msculos y nervios, especialmente a causa de lo siguiente:  ? Lesin en la mdula espinal.  ? Accidente cerebrovascular.  ? Esclerosis mltiple.  ? La enfermedad de Parkinson.   Diabetes.   Estreimiento.  Qu incrementa el riesgo?  Puede correr un mayor riesgo de desarrollar vejiga hiperactiva si usted:   Es un adulto mayor.   Fuma.   Est atravesando la menopausia.   Tiene problemas de prstata.   Tiene una enfermedad neurolgica, como accidente cerebrovascular, demencia, enfermedad de Parkinson o esclerosis mltiple (EM).   Ingiere alimentos o bebidas que irritan la vejiga. Entre ellos se incluyen el alcohol, los alimentos picantes y la cafena.   Tiene sobrepeso u obesidad.  Cules son los signos o  los sntomas?  Los sntomas de esta afeccin incluyen los siguientes:   Una urgencia repentina e intensa de orinar.   Prdida de orina.   Orina 8 o ms veces por da.   Levantarse para orinar 2 o ms veces por noche.  Cmo se diagnostica?  El mdico puede sospechar la presencia de vejiga hiperactiva en funcin de los sntomas que presente. El mdico diagnosticar esta afeccin mediante:   Un examen fsico y antecedentes mdicos.   Anlisis de sangre u orina. Es posible que necesite anlisis de orina o pruebas en la vejiga para ayudar a determinar cul es la causa de su vejiga hiperactiva.  Es posible que tambin tenga que consultar a un mdico especialista en enfermedades de las vas urinarias (urlogo).  Cmo se trata?  El tratamiento para el trastorno de vejiga hiperactiva depende de la causa y la gravedad de su enfermedad. Tambin puede hacer cambios en su estilo de vida en su casa. Entre las opciones se incluyen las siguientes:   Entrenamiento de la vejiga. Esto puede incluir lo siguiente:  ? Aprender a controlar la necesidad urgente de orinar siguiendo un programa que lo obliga a orinar en intervalos regulares (vaciamiento cronometrado).  ? Hacer ejercicios de Kegel para fortalecer los msculos del piso plvico que sostienen la vejiga. La tonificacin de estos msculos puede ayudarlo a controlar las micciones, aun si hay hiperactividad en los msculos de la vejiga.   Dispositivos especiales. Esto puede incluir lo siguiente:  ? Biorretroalimentacin, que utiliza sensores para ayudarlo a estar atento a   las seales del cuerpo.  ? Estimulacin elctrica, que utiliza electrodos que se colocan dentro del cuerpo (implantados) o fuera del cuerpo. Estos electrodos envan pulsos elctricos suaves para fortalecer los nervios o los msculos que controlan la vejiga.  ? Las mujeres pueden utilizar un dispositivo de plstico que calza en la vagina y sostiene la vejiga (pesario).   Medicamentos.  ? Antibiticos para  tratar las infecciones en la vejiga.  ? Antiespasmdicos para evitar que la vejiga elimine orina en el momento incorrecto.  ? Antidepresivos tricclicos para relajar los msculos de la vejiga.  ? Inyecciones de toxina botulnica tipo A directamente en el tejido de la vejiga para relajar los msculos de la vejiga.   Cambios en el estilo de vida. Esto puede incluir lo siguiente:  ? Prdida de peso. Hable con su mdico sobre los mtodos para perder peso que funcionaran mejor para usted.  ? Cambios en la dieta. Esto puede incluir la reduccin de la cantidad de alcohol y de cafena que consume, o beber lquidos en distintos momentos del da.  ? No fumar. No consuma ningn producto que contenga nicotina o tabaco, como cigarrillos y cigarrillos electrnicos. Si necesita ayuda para dejar de fumar, consulte al mdico.   Ciruga.  ? Puede implantarse un dispositivo para ayudar a controlar las seales nerviosas que controlan la miccin.  ? Puede implantarse un electrodo para estimular las seales elctricas en la vejiga.  ? Puede realizarse un procedimiento para cambiar la forma de la vejiga. Esto solo se realiza en casos muy graves.  Siga estas indicaciones en su casa:  Estilo de vida   Modifique su dieta o estilo de vida como se lo haya recomendado su mdico. Esto pueden incluir lo siguiente:  ? Beba menos cantidad de lquido o beber lquido en distintos momentos del da.  ? Reduzca la ingesta de cafena o alcohol.  ? Haga los ejercicios de Kegel.  ? Baje de peso, si es necesario.  ? Ingiera una dieta saludable y equilibrada para evitar el estreimiento. Esto puede incluir lo siguiente:   Consuma alimentos ricos en fibra, como frutas y verduras frescas, cereales integrales y frijoles.   Limite el consumo de alimentos ricos en grasas y azcares procesados, como alimentos fritos o dulces.  Instrucciones generales   Tome los medicamentos de venta libre y los recetados solamente como se lo haya indicado el mdico.   Si  le recetaron un antibitico, tmelo como se lo haya indicado el mdico. No deje de tomar el antibitico aunque comience a sentirse mejor.   Use los implantes o el pesario como se lo haya indicado el mdico.   Si es necesario, use apsitos para absorber cualquier prdida de orina que pueda tener.   Lleve un diario o libro de anotaciones para registrar la cantidad de lquidos que ingiere y cundo lo hace, y cundo siente necesidad de orinar. Esto ayudar a su mdico a controlar su enfermedad.   Concurra a todas las visitas de control como se lo haya indicado el mdico. Esto es importante.  Comunquese con un mdico si:   Tiene fiebre.   Los sntomas no mejoran con el tratamiento.   El dolor y las molestias empeoran.   Tiene necesidad urgente de orinar con mayor frecuencia.  Solicite ayuda de inmediato si:   No puede controlar la vejiga.  Resumen   Vejiga hiperactiva se refiere a una afeccin en la que la persona tiene una necesidad sbita de orinar.   Varias afecciones pueden causar   sntomas de vejiga hiperactiva.   El tratamiento para la vejiga hiperactiva depende de la causa y la gravedad de la afeccin.   Siga las instrucciones del mdico acerca de hacer cambios en el estilo de vida, hacer los ejercicios de Kegel, llevar un diario y tomar los medicamentos.  Esta informacin no tiene como fin reemplazar el consejo del mdico. Asegrese de hacerle al mdico cualquier pregunta que tenga.  Document Released: 10/20/2012 Document Revised: 01/13/2018 Document Reviewed: 01/13/2018  Elsevier Interactive Patient Education  2019 [employer]

## 2019-02-08 ENCOUNTER — Ambulatory Visit: Payer: Medicare Other | Admitting: Urology

## 2019-03-01 ENCOUNTER — Telehealth: Payer: Self-pay | Admitting: Urology

## 2019-03-01 ENCOUNTER — Other Ambulatory Visit: Payer: Self-pay | Admitting: Urology

## 2019-03-01 DIAGNOSIS — N401 Enlarged prostate with lower urinary tract symptoms: Principal | ICD-10-CM

## 2019-03-01 DIAGNOSIS — N138 Other obstructive and reflux uropathy: Secondary | ICD-10-CM

## 2019-03-01 MED ORDER — FINASTERIDE 5 MG PO TABS: 5.0000 mg | ORAL_TABLET | Freq: Every day | ORAL | 3 refills | Status: DC

## 2019-03-01 NOTE — Telephone Encounter (Signed)
Refill for finasteride sent to [facility].

## 2019-03-01 NOTE — Addendum Note (Signed)
Addended by: Anne Fu on: 03/01/2019 01:25 PM   Modules accepted: Orders

## 2019-03-01 NOTE — Telephone Encounter (Signed)
Pt needs refill for Finasteride, sent to [facility].

## 2019-04-25 ENCOUNTER — Telehealth: Payer: Self-pay | Admitting: Urology

## 2019-04-25 NOTE — Telephone Encounter (Signed)
Pt is having pain around his belly button.  He would like a call back.  It bothers him at night time.  He feels better after he uses the bathroom.  He says Dr Diamantina Providence already knows about it.

## 2019-04-26 NOTE — Telephone Encounter (Signed)
Patient has not been seen since March describes bladder spasms (OAB) should he come in for a visit or be scheduled for virtual?

## 2019-04-27 NOTE — Telephone Encounter (Signed)
Interpreter HT#[id number]  Patient states he now does not think pain is from his bladder he spoke to [facility] and they are sending in a medication which he is not sure of the medication name or purpose. Patient is a poor historian and seems somewhat confused. Patient was offered to make an appointment in the office with Dr. Al Pimple to discuss treatment options for his bladder he declined at this time and said if his bladder starts to bother him again he will call back

## 2019-04-27 NOTE — Telephone Encounter (Signed)
My last note said he was going to start PTNS. I would recommend following up to start that, if he has changed his mine I can see him again in person. He is spanish speaking only so virtual not ideal  Nickolas Madrid, MD 04/27/2019

## 2019-04-28 ENCOUNTER — Other Ambulatory Visit: Payer: Self-pay | Admitting: Primary Care

## 2019-04-28 ENCOUNTER — Other Ambulatory Visit (HOSPITAL_COMMUNITY): Payer: Self-pay | Admitting: Primary Care

## 2019-04-28 DIAGNOSIS — N400 Enlarged prostate without lower urinary tract symptoms: Secondary | ICD-10-CM

## 2019-05-03 ENCOUNTER — Other Ambulatory Visit: Payer: Self-pay

## 2019-05-03 ENCOUNTER — Encounter (INDEPENDENT_AMBULATORY_CARE_PROVIDER_SITE_OTHER): Payer: Self-pay

## 2019-05-03 ENCOUNTER — Ambulatory Visit
Admission: RE | Admit: 2019-05-03 | Discharge: 2019-05-03 | Disposition: A | Discharge status: Home / Self Care | Payer: Medicare Other | Source: Ambulatory Visit | Attending: Primary Care | Admitting: Primary Care

## 2019-05-03 DIAGNOSIS — N400 Enlarged prostate without lower urinary tract symptoms: Secondary | ICD-10-CM | POA: Diagnosis present

## 2019-05-11 ENCOUNTER — Other Ambulatory Visit: Payer: Self-pay | Admitting: Primary Care

## 2019-05-11 DIAGNOSIS — N400 Enlarged prostate without lower urinary tract symptoms: Secondary | ICD-10-CM

## 2019-05-17 ENCOUNTER — Encounter: Payer: Self-pay | Admitting: Family Medicine

## 2019-07-07 ENCOUNTER — Encounter: Payer: Self-pay | Admitting: Urology

## 2019-07-07 ENCOUNTER — Other Ambulatory Visit: Payer: Self-pay

## 2019-07-07 ENCOUNTER — Ambulatory Visit (INDEPENDENT_AMBULATORY_CARE_PROVIDER_SITE_OTHER): Payer: Medicare Other | Admitting: Urology

## 2019-07-07 VITALS — BP 166/85 | HR 59 | Ht 68.9 in | Wt 205.0 lb

## 2019-07-07 DIAGNOSIS — R1030 Lower abdominal pain, unspecified: Secondary | ICD-10-CM | POA: Diagnosis not present

## 2019-07-07 DIAGNOSIS — R3129 Other microscopic hematuria: Secondary | ICD-10-CM

## 2019-07-07 DIAGNOSIS — N401 Enlarged prostate with lower urinary tract symptoms: Secondary | ICD-10-CM | POA: Diagnosis not present

## 2019-07-07 DIAGNOSIS — N138 Other obstructive and reflux uropathy: Secondary | ICD-10-CM | POA: Diagnosis not present

## 2019-07-07 LAB — URINALYSIS, COMPLETE
Bilirubin, UA: NEGATIVE
Glucose, UA: NEGATIVE
Ketones, UA: NEGATIVE
Leukocytes,UA: NEGATIVE
Nitrite, UA: NEGATIVE
Protein,UA: NEGATIVE
Specific Gravity, UA: 1.02 (ref 1.005–1.030)
Urobilinogen, Ur: 0.2 mg/dL (ref 0.2–1.0)
pH, UA: 6 (ref 5.0–7.5)

## 2019-07-07 LAB — MICROSCOPIC EXAMINATION: Bacteria, UA: NONE SEEN

## 2019-07-07 LAB — BLADDER SCAN AMB NON-IMAGING

## 2019-07-07 NOTE — Progress Notes (Signed)
   07/07/2019 11:41 AM   Add Alejandro Steele [birth year] [id number]  Reason for visit: Follow up BPH/LUTS/OAB  HPI: I saw [name] back in urology clinic today for follow-up of urinary symptoms.  He has a long history of primarily overactive symptoms including urgency, frequency, and nocturia.  Urodynamics showed a 100 cc bladder with overactivity.  He is currently on finasteride and Detrol which has been a good combination for him.  He also underwent a negative cystoscopy with Dr. Pilar Steele in July 2018.  He presents today with his baseline overactive symptoms, but worsening periumbilical pain that is improved after voiding.  PVR is 2 cc.  He denies any dysuria.  He denies any smoking history or other carcinogenic exposures.  He does have new microscopic hematuria today with 3-10 RBCs.  Urinalysis is otherwise benign.   ROS: Please see flowsheet from today's date for complete review of systems.  Physical Exam: BP (!) 166/85 (BP Location: Left Arm, Patient Position: Sitting)   Pulse (!) 59   Ht 5' 8.9" (1.75 m)   Wt 205 lb (93 kg)   BMI 30.36 kg/m    Assessment & Plan:   In summary, he is a 73 year old male with chronic OAB symptoms relatively well managed on finasteride and Detrol.  He is here today with a new complaint of some periumbilical pain that is improved after voiding.  He also has new microscopic hematuria on urinalysis today.  We discussed the new AUA guidelines that differentiate microscopic hematuria work-up into low, intermediate, and high risk.  With his age, he falls into the high risk category which would warrant cystoscopy and CT urogram.  We discussed possible etiologies including BPH, nephrolithiasis, malignancy, or idiopathic.  -RTC for CT urogram results and cystoscopy -If above work-up negative and persistent pain, consider addition of amitriptyline  A total of 15 minutes were spent face-to-face with the patient, greater than 50% was spent in patient  education, counseling, and coordination of care regarding microscopic hematuria and urinary symptoms.   Alejandro Steele, [facility] [address] [place], [place] [address] [phone]

## 2019-07-07 NOTE — Patient Instructions (Signed)
Cistoscopia  Cystoscopy  La cistoscopia es un procedimiento que se utiliza para ayudar a diagnosticar y, a veces, tratar afecciones que afectan las vías urinarias inferiores. Las vías urinarias inferiores incluyen la vejiga y la uretra. La uretra es el conducto por el que drena la orina de la vejiga. La cistoscopia se hace con un instrumento fino en forma de tubo con una luz y una cámara en el extremo (cistoscopio). El cistoscopio puede ser duro o flexible, según el objetivo del procedimiento. El cistoscopio se inserta por la uretra e ingresa a la vejiga.  La cistoscopia se puede recomendar en los siguientes casos:  · Infecciones de las vías urinarias que se repiten.  · Sangre en la orina (hematuria).  · Incapacidad para controlar la orina (incontinencia urinaria) o vejiga hiperactiva.  · Células inusuales que se encuentran en una muestra de orina.  · Una obstrucción en la uretra, como un cálculo urinario.  · Dolor al orinar.  · Una anormalidad en la vejiga que se encuentra durante una pielografía intravenosa (PIV) o exploración por tomografía computarizada (TC).  La cistoscopia también puede realizarse para extraer una muestra de tejido para examinarla con un microscopio (biopsia).  Informe al médico acerca de lo siguiente:  · Cualquier alergia que tenga.  · Todos los medicamentos que utiliza, incluidos vitaminas, hierbas, gotas oftálmicas, cremas y medicamentos de venta libre.  · Cualquier problema previo que usted o algún miembro de su familia hayan tenido con los anestésicos.  · Cualquier trastorno de la sangre que tenga.  · Cirugías a las que se haya sometido.  · Cualquier afección médica que tenga.  · Si está embarazada o podría estarlo.  ¿Cuáles son los riesgos?  En general, se trata de un procedimiento seguro. Sin embargo, pueden ocurrir complicaciones, por ejemplo:  · Infección.  · Sangrado.  · Reacciones alérgicas a los medicamentos.  · Daños a otras estructuras u órganos.  ¿Qué ocurre antes del  procedimiento?  · Consulte al médico sobre:  ? Cambiar o suspender los medicamentos que toma habitualmente. Esto es muy importante si toma medicamentos para la diabetes o anticoagulantes.  ? Tomar medicamentos como aspirina e ibuprofeno. Estos medicamentos pueden tener un efecto anticoagulante en la sangre. No tome estos medicamentos a menos que el médico se lo indique.  ? Tomar medicamentos de venta libre, vitaminas, hierbas y suplementos.  · Siga las instrucciones del médico respecto de las restricciones en las comidas o las bebidas.  · Pregúntele al médico qué medidas se tomarán para ayudar a prevenir una infección. Estas medidas pueden incluir:  ? Lavar la piel con un jabón antiséptico.  ? Suministrar antibióticos.  · Pueden hacerle un examen o estudios, tales como:  ? Radiografías de la vejiga, la uretra o los riñones.  ? Análisis de orina para verificar si hay signos de infección.  · Haga que alguien lo lleve a su casa desde el hospital o la clínica.  ¿Qué ocurre durante el procedimiento?       · Le administrarán uno o más de los siguientes medicamentos:  ? Un medicamento para ayudar a relajarse (sedante).  ? Un medicamento para adormecer la zona (anestesia local).  · Se limpiará la zona que se encuentra alrededor de la abertura de la uretra.  · El cistoscopio se introducirá por la uretra e ingresará a la vejiga.  · Un líquido estéril fluirá por el cistoscopio y rellenará la vejiga. El líquido estirará la vejiga para que el médico pueda examinar claramente las paredes de la   vejiga.  · El médico examinará la uretra y la vejiga. El médico puede tomar una biopsia o extraer cálculos.  · El cistoscopio se retirará y la vejiga se vaciará.  El procedimiento puede variar según el médico y el hospital.  ¿Qué puedo esperar después del procedimiento?  Después del procedimiento, es común tener los siguientes síntomas:  · Algo de dolor o molestias en el abdomen y la uretra.  · Síntomas urinarios. Estos incluyen los  siguientes:  ? Dolor o ardor leves al orinar. El dolor debe ceder unos minutos después de orinar. Esto puede durar 1 semana.  ? Una pequeña cantidad de sangre en la orina durante varios días.  ? Sentir que necesita orinar pero produce solo una pequeña cantidad de orina.  Siga estas instrucciones en su casa:  Medicamentos  · Tome los medicamentos de venta libre y los recetados solamente como se lo haya indicado el médico.  · Si le recetaron un antibiótico, tómelo como se lo haya indicado el médico. No deje de tomar el antibiótico, aunque comience a sentirse mejor.  Instrucciones generales  · Retome sus actividades normales según lo indicado por el médico. Pregúntele al médico qué actividades son seguras para usted.  · No conduzca durante 24 horas si le administraron un sedante durante el procedimiento.  · Observe si hay sangre en la orina. Si la cantidad de sangre en la orina aumenta, comuníquese con el médico.  · Siga las instrucciones del médico respecto de las restricciones en las comidas o las bebidas.  · Si se tomó una muestra de tejido para análisis (biopsia) durante el procedimiento, depende de usted obtener los resultados de la prueba. Consulte al médico o pregunte en el departamento donde se realiza la prueba cuándo estarán listos los resultados.  · Beba suficiente líquido como para mantener la orina de color amarillo pálido.  · Concurra a todas las visitas de seguimiento como se lo haya indicado el médico. Esto es importante.  Comuníquese con un médico si:  · Tiene un dolor que empeora o que no mejora con los medicamentos, especialmente al orinar.  · Tiene problemas para orinar.  · Observa más sangre en la orina.  Solicite ayuda inmediatamente si:  · Hay coágulos de sangre en la orina.  · Tiene dolor abdominal.  · Tiene fiebre o escalofríos.  · No puede orinar.  Resumen  · La cistoscopia es un procedimiento que se utiliza para ayudar a diagnosticar y, a veces, tratar afecciones que afectan las vías  urinarias inferiores.  · La cistoscopia se hace con un instrumento fino en forma de tubo con una luz y una cámara en el extremo.  · Después del procedimiento, es común tener algo de sensibilidad o dolor en el abdomen y la uretra.  · Observe si hay sangre en la orina. Si la cantidad de sangre en la orina aumenta, comuníquese con el médico.  · Si le recetaron un antibiótico, tómelo como se lo haya indicado el médico. No deje de tomar el antibiótico, aunque comience a sentirse mejor.  Esta información no tiene como fin reemplazar el consejo del médico. Asegúrese de hacerle al médico cualquier pregunta que tenga.  Document Released: 11/03/2005 Document Revised: 11/24/2018 Document Reviewed: 11/24/2018  [employer]vier Patient Education © 2020 [employer]   

## 2019-07-12 ENCOUNTER — Other Ambulatory Visit: Payer: Self-pay

## 2019-07-12 ENCOUNTER — Ambulatory Visit (INDEPENDENT_AMBULATORY_CARE_PROVIDER_SITE_OTHER): Payer: Medicare Other | Admitting: Gastroenterology

## 2019-07-12 ENCOUNTER — Encounter: Payer: Self-pay | Admitting: Gastroenterology

## 2019-07-12 VITALS — BP 141/82 | HR 66 | Temp 97.7°F | Ht 68.0 in | Wt 207.8 lb

## 2019-07-12 DIAGNOSIS — K219 Gastro-esophageal reflux disease without esophagitis: Secondary | ICD-10-CM

## 2019-07-12 DIAGNOSIS — R140 Abdominal distension (gaseous): Secondary | ICD-10-CM | POA: Diagnosis not present

## 2019-07-12 MED ORDER — OMEPRAZOLE 40 MG PO CPDR: 40.0000 mg | DELAYED_RELEASE_CAPSULE | Freq: Every day | ORAL | 1 refills | Status: DC

## 2019-07-12 NOTE — Patient Instructions (Addendum)
Dieta sin lactosa, en adultos Lactose-Free Diet, Adult Si tiene intolerancia a la lactosa, no puede Manufacturing systems engineer. La lactosa es el azcar natural que se encuentra principalmente en la [place] de origen animal y los productos lcteos. Es posible que Product/process development scientist todos los alimentos y las bebidas que contengan lactosa. Para lograr esto, una dieta sin lactosa puede ayudarlo. Qu alimentos contienen lactosa? La lactosa se encuentra en la [place] de origen animal y en los productos lcteos, como los siguientes:  Yogur.  Queso.  Gregory.  Margarina.  Phoebe Sharps.  Crema.  Coberturas batidas y sustitutos de crema.  Helados y otros postres a base de lcteos. Los alimentos o los productos elaborados con leche de origen animal o ingredientes lcteos tambin contienen [place]. Para averiguar si un alimento contiene leche de origen animal o un ingrediente lcteo, consulte la lista de ingredientes. Evite los alimentos con la leyenda "Puede contener [employer]" y los alimentos que contengan:  Leche en polvo.  Suero de Natural Steps.  Cuajada.  Caseinato.  Lactosa.  Lactalbmina.  Lactoglobulina. Cules son las alternativas para la [place] de origen animal y los alimentos elaborados con productos lcteos?  Leche sin lactosa.  Leche de soja con agregado de calcio y vitamina D.  Leche de [place], de coco o de arroz, u otras alternativas de leches que no son de origen animal con agregado de calcio y vitamina D. Tenga en cuenta que estos productos tienen bajo contenido de protenas.  Productos de soja, como yogur de [place], queso de soja, helado de soja y crema agria a base de soja.  Otros productos lcteos de frutos secos, como yogur de [place], queso de [place], yogur de [place] de caj, queso de castaas de caj, helado de castaas de caj, yogur de coco y helado de coco. Cules son algunos consejos para seguir este plan?  No consuma alimentos, bebidas, vitaminas, minerales ni  medicamentos que contengan lactosa. Lea atentamente las listas de ingredientes.  Busque el trmino "sin lactosa" en las etiquetas.  Use gotas o comprimidos de enzima Chartered loss adjuster se lo haya indicado el mdico.  Use leche sin lactosa o un producto alternativo para la [place], como [place] de soja o de [place], para beber y Audiological scientist.  Consuma suficiente calcio y vitaminaD en la dieta. Un plan de alimentacin sin lactosa puede carecer de estos nutrientes importantes.  Tome los suplementos de calcio y vitaminaD como se lo haya indicado el mdico. Hable con el mdico sobre los suplementos, si no puede obtener suficiente calcio y vitaminaD de los alimentos. Qu alimentos puedo comer?  Frutas Todas las frutas frescas, en lata, congeladas o secas que no estn procesadas con lactosa. Verduras Todas las verduras frescas, congeladas y en lata sin queso, crema ni salsas de [place]. Cereales [place] no elaborado con leche de origen animal o productos lcteos. Carnes y otras protenas Cualquier tipo de carne, pescado, ave de corral, y otras fuentes de protenas no elaborados con Air traffic controller de origen animal o productos lcteos. Queso y yogur de soja. Grasas y aceites Cualquiera no elaborado con [place] de origen animal o productos lcteos. Bebidas Leche sin lactosa. Leche de soja, arroz o almendras con agregado de calcio y vitamina D. Jugos de frutas y verduras. Dulces y postres Cualquiera no elaborado con [place] de origen animal o productos lcteos. Alios y condimentos Cualquiera no elaborado con [place] de origen animal o productos lcteos. Calcio Muchos alimentos con lactosa contienen calcio, que es importante para la salud sea. La cantidad de calcio que  necesita depende de su edad:  Adultos menores de 50aos de edad: 1000mg  de calcio por da.  Adultos mayores de 50aos de edad: 1200mg  de Forensic psychologist. Si no consume la cantidad suficiente de calcio, puede recurrir a otras fuentes de  calcio, como por ejemplo:  Jugo de naranja con agregado de calcio. Una taza de jugo de naranja contiene entre 300mg  y 350mg  de calcio.  Leche de soja fortificada con calcio. Una taza de [place] de soja fortificada con calcio contiene entre 300mg  y 400mg  de calcio.  Leche de almendras o de arroz fortificada con calcio. Una taza de [place] de almendras o de arroz fortificada con calcio contiene 300mg  de calcio.  Cereales fortificados con calcio para el desayuno. Los cereales fortificados con calcio para el desayuno contienen entre 100mg  y 1000mg  de calcio.  [place]aca cocida. Media taza de espinaca cocida contiene 145mg  de calcio.  Vainas de soja cocidas. Media taza de vainas de soja cocidas contiene 130mg  de calcio.  Coles berzas cocidas. Media taza de coles berzas cocidas contiene 125mg  de calcio.  Col rizada congelada o cocida. Media taza de col rizada congelada o cocina contiene 90mg  de calcio.  Almendras. Un cuarto de taza de almendras contiene 95mg  de calcio.  Brcoli cocido. Una taza de brcoli cocido contiene 60mg  de calcio. Es posible que los productos mencionados arriba no formen una lista completa de las bebidas o los alimentos recomendados. Comunquese con un nutricionista para conocer ms opciones. Qu alimentos no se recomiendan? Frutas [place], excepto que estn elaboradas con [place] de origen animal o productos lcteos. Verduras [place], excepto que estn elaboradas con [place] de origen animal o productos lcteos. Cereales Cualquiera elaborado con leche de origen animal o productos lcteos. Carnes y otras protenas [place], excepto que estn elaboradas con [place] de origen animal o productos lcteos. McDonald's Corporation, entre ellos, [place], [place], suero de [address], kefir, [place], [place] saborizada, [place], [place] condensada, [place] de [place], [place], Dentist, Lionel December y quesos procesados. Grasas y aceites Cualquiera elaborado con  [place] o productos lcteos. Margarinas y aderezos para [address] que contengan [place] o [place]. Crema. Mitad leche y English as a second language teacher. Queso crema. Tami Ribas. Salsas para untar elaboradas con crema agria o yogur. Bebidas Chocolate caliente. Cacao con lactosa. Ts helados instantneos. Bebidas frutales en polvo. Batidos preparados con [employer] de origen Lawyer. Dulces y postres Cualquiera elaborado con [place] o productos lcteos. Alios y condimentos [place] de Theatre manager que [place]. Mezclas de especias si contienen lactosa. Edulcorantes artificiales que Quarry manager. Sustitutos de cremas no lcteas. Es posible que los productos que se enumeran ms arriba no sean una lista completa de los alimentos y las bebidas que se Theatre stage manager. Comunquese con un nutricionista para obtener ms informacin. Resumen  Si tiene intolerancia a Water quality scientist, significa que tiene dificultad para digerir la lactosa, un azcar natural que se halla en la [place] y los productos lcteos.  Seguir una dieta sin lactosa puede ayudarlo a Research officer, trade union.  El calcio es importante para la salud de los huesos y se Occupational psychologist en muchos alimentos que contienen [place]. Hable con el mdico sobre otras fuentes de calcio. Esta informacin no tiene Theme park manager el consejo del mdico. Asegrese de hacerle al mdico cualquier pregunta que tenga. Document Released: 11/03/2005 Document Revised: 01/08/2018 Document Reviewed: 01/08/2018 Elsevier Patient Education  2020 Elsevier Inc.  Enfermedad de reflujo gastroesofgico en los adultos Gastroesophageal Reflux Disease, Adult El reflujo gastroesofgico (RGE) ocurre cuando  el cido del estmago sube por el tubo que conecta la boca con el estmago (esfago). Normalmente, la comida baja por el esfago y se mantiene en el [address], donde se la digiere. Cuando una persona tiene RGE, los alimentos y el cido estomacal suelen volver al esfago. Usted puede tener una enfermedad  llamada enfermedad de reflujo gastroesofgico (ERGE) si el reflujo:  Sucede a menudo.  Causa sntomas frecuentes o muy intensos.  Causa problemas tales como dao en el esfago. Cuando esto ocurre, el esfago duele y se hincha (inflama). Con el tiempo, la ERGE puede ocasionar pequeos agujeros (lceras) en el revestimiento del esfago. Cules son las causas? Esta afeccin se debe a un problema en el msculo que se encuentra entre el esfago y Claremontel estmago. Cuando este msculo est dbil o no es normal, no se cierra correctamente para impedir que los alimentos y el cido regresen del Teaching laboratory technicianestmago. El msculo puede debilitarse debido a lo siguiente:  El consumo de Quebradillastabaco.  ArimoEmbarazo.  Tener cierto tipo de hernia (hernia de hiato).  Consumo de alcohol.  Ciertos alimentos y bebidas, como caf, chocolate, cebollas y Mountainhomementa. Qu incrementa el riesgo? Es ms probable que tenga esta afeccin si:  Tiene sobrepeso.  Tiene una enfermedad que afecta el tejido conjuntivo.  Botswanasa antiinflamatorios no esteroideos (AINE). Cules son los signos o los sntomas? Los sntomas de esta afeccin incluyen:  Acidez estomacal.  Dificultad o dolor al tragar.  Sensacin de Warehouse managertener un bulto en la garganta.  Sabor amargo en la boca.  Mal aliento.  Tener una gran cantidad de saliva.  Estmago inflamado o con Dentistmalestar.  Eructos.  Dolor en el pecho. El dolor de pecho puede deberse a distintas afecciones. Asegrese de Science writerconsultar a su mdico si tiene Journalist, newspaperdolor en el pecho.  Falta de aire o respiracin ruidosa (sibilancias).  Tos constante (crnica) o durante la noche.  Desgaste de la superficie de los dientes (esmalte dental).  Prdida de peso. Cmo se trata? El tratamiento depender de la gravedad de los sntomas. El mdico puede sugerirle lo siguiente:  Cambios en la dieta.  Medicamentos.  Cipriano MileUna ciruga. Siga estas indicaciones en su casa: Comida y bebida   Siga una dieta como se lo haya indicado  el mdico. Es posible que deba evitar alimentos y bebidas, por ejemplo: ? Caf y t (con o sin cafena). ? Bebidas que contengan alcohol. ? Bebidas energticas y deportivas. ? Bebidas gaseosas y refrescos. ? Chocolate y cacao. ? Menta y esencia de Morralmenta. ? Ajo y cebolla. ? Rbano picante. ? Alimentos cidos y condimentados. Estos incluyen todos los tipos de pimientos, [place] en polvo, curry en polvo, vinagre, salsas picantes y Occidental Petroleumsalsa barbacoa. ? Ctricos y sus jugos, por ejemplo, naranjas, limones y limas. ? Alimentos que CSX Corporationcontengan tomate. Estos incluyen salsa roja, [place], salsa picante y pizza con salsa de Sunburytomate. ? Alimentos fritos y Lexicographergrasos. Estos incluyen donas, papas fritas, papitas fritas de bolsa y aderezos con alto contenido de Antarctica (the [place] of 60 deg S)grasa. ? Carnes con alto contenido de [place] (the [place] of 60 deg S)grasa. Estas incluye los perros calientes, chuletas o costillas, embutidos, jamn y tocino. ? Productos lcteos ricos en grasas, como leche Picuris Puebloentera, Havremanteca y Symondsqueso crema.  Consuma pequeas cantidades de comida con ms frecuencia. Evite consumir porciones abundantes.  Evite beber grandes cantidades de lquidos con las comidas.  Evite comer 2 o 3horas antes de acostarse.  Evite recostarse inmediatamente despus de comer.  No haga ejercicios enseguida despus de comer. Estilo de vida   No consuma ningn producto que contenga nicotina  o tabaco. Estos incluyen cigarrillos, cigarrillos electrnicos y tabaco para Theatre managermascar. Si necesita ayuda para dejar de fumar, consulte al American Expressmdico.  Intente reducir J. C. Penneyel nivel de estrs. Si necesita ayuda para hacer esto, consulte al mdico.  Si tiene sobrepeso, baje una cantidad de peso saludable para usted. Consulte a su mdico para bajar de peso de MetLifemanera segura. Indicaciones generales  Est atento a cualquier cambio en los sntomas.  Tome los medicamentos de venta libre y los recetados solamente como se lo haya indicado el mdico. No tome aspirina, ibuprofeno ni otros AINE a menos que el  mdico lo autorice.  Use ropa holgada. No use nada apretado alrededor de la cintura.  Levante (eleve) la cabecera de la cama aproximadamente 6pulgadas (15cm).  Evite inclinarse si al hacerlo empeoran los sntomas.  Concurra a todas las visitas de [address]segu[address] como se lo haya indicado el mdico. Esto es importante. Comunquese con un mdico si:  Aparecen nuevos sntomas.  Adelgaza y no sabe por qu.  Tiene problemas para tragar o le duele cuando traga.  Tiene sibilancias o tos persistente.  Los sntomas no mejoran con Scientist, research (medical)el tratamiento.  Tiene la voz ronca. Solicite ayuda inmediatamente si:  Goldman SachsSiente dolor en los brazos, el cuello, la Nunnmandbula, los dientes o la espalda.  Se siente transpirado, mareado o tiene una sensacin de desvanecimiento.  Siente falta de aire o Journalist, newspaperdolor en el pecho.  Vomita y el vmito tiene un aspecto similar a la sangre o a los posos de caf.  Pierde el conocimiento (se desmaya).  Las deposiciones (heces) son sanguinolentas o negras.  No puede tragar, beber o comer. Resumen  Si una persona tiene enfermedad de reflujo gastroesofgico (ERGE), los alimentos y el cido estomacal suben al esfago y causan sntomas o problemas tales como dao en el esfago.  El tratamiento depender de la gravedad de los sntomas.  Siga una Air traffic controllerdieta como se lo haya indicado el mdico.  Tome todos los medicamentos solamente como se lo haya indicado el mdico. Esta informacin no tiene Theme park managercomo fin reemplazar el consejo del mdico. Asegrese de hacerle al mdico cualquier pregunta que tenga. Document Released: 12/06/2010 Document Revised: 06/17/2018 Document Reviewed: 06/17/2018 Elsevier Patient Education  2020 Elsevier Inc.  Opciones de alimentos para pacientes adultos con enfermedad de reflujo gastroesofgico Food Choices for Gastroesophageal Reflux Disease, Adult Si tiene enfermedad de reflujo gastroesofgico (ERGE), los alimentos que consume y los hbitos de alimentacin son  Engineer, productionmuy importantes. Elegir los alimentos adecuados puede ayudar a Altria Groupaliviar las molestias. Piense en consultar a un especialista en nutricin (nutricionista) para que lo ayude a Associate Professorhacer buenas elecciones. Consejos para seguir Goodrich Corporationeste plan  Comidas  Elija alimentos saludables con bajo contenido de grasa, como frutas, verduras, cereales integrales, productos lcteos descremados y carne San Marinomagra de Worthvillevaca, de pescado y de MontanaNebraskaave.  Haga comidas pequeas durante Glass blower/designerel da en lugar de 3 comidas abundantes. Coma lentamente y en un lugar donde est distendido. Evite agacharse o recostarse hasta 2 o 3horas despus de haber comido.  Evite comer 2 a 3horas antes de ir a acostarse.  Evite beber grandes cantidades de lquidos con las comidas.  Evite frer los alimentos a la hora de la coccin. Puede hornear, grillar o asar a la parrilla.  Evite o limite la cantidad de: ? Chocolate. ? Menta y mentol. ? Alcohol. ? Pimienta. ? Caf negro y descafeinado. ? T negro y descafeinado. ? Bebidas con gas (gaseosas). ? Bebidas energizantes y refrescos que contengan cafena.  Limite los alimentos con alto contenido  de grasas, por ejemplo: ? Carnes grasas o alimentos fritos. ? Leche entera, crema, manteca o helado. ? Nueces y Natalia de frutos secos. ? Pastelera, donas y dulces hechos con [place] o [place].  Evite los alimentos que le ocasionen sntomas. Estos pueden ser distintos para Advertising account planner. Los alimentos que suelen causan sntomas son los siguientes: ? Haematologist. ? Naranjas, limones y limas. ? Pimientos. ? Comidas condimentadas. ? Cebolla y Kae Heller. ? Vinagre. Estilo de vida  Mantenga un peso saludable. Pregntele a su mdico cul es el peso saludable para usted. Si necesita perder peso, hable con su mdico para hacerlo de manera segura.  Realice actividad fsica durante, al menos, 30 minutos 5 das por semana o ms, o segn lo indicado por su mdico.  Use ropa suelta.  No fume. Si necesita ayuda para  dejar de fumar, consulte al mdico.  Duerma con la cabecera de la cama ms elevada que los pies. Use una cua debajo del colchn o bloques debajo del armazn de la cama para Pharmacologist la cabecera de la cama elevada. Resumen  Si tiene enfermedad de reflujo gastroesofgico (ERGE), las elecciones de alimentos y el [place] de vida son muy importantes para ayudar a Paramedic los sntomas.  Haga comidas pequeas durante Glass blower/designer de 3 comidas abundantes. Coma lentamente y en un lugar donde est distendido.  Limite los alimentos con alto contenido graso como la carne grasa o los alimentos fritos.  Evite agacharse o recostarse hasta 2 o 3horas despus de haber comido.  Evite la menta y [place] buena, la cafena, el alcohol y el chocolate. Esta informacin no tiene Theme park manager el consejo del mdico. Asegrese de hacerle al mdico cualquier pregunta que tenga. Document Released: 05/04/2012 Document Revised: 06/09/2017 Document Reviewed: 06/09/2017 Elsevier Patient Education  2020 ArvinMeritor.

## 2019-07-12 NOTE — Progress Notes (Signed)
Arlyss Repressohini R Cristen Murcia, MD [address]  [address]  [place], [place] [address]  Main: [phone]  Fax: [phone]    Gastroenterology Consultation  Referring Provider:     Emogene MorganAycock, Ngwe A, MD Primary Care Physician:  [facility] Primary Gastroenterologist:  Dr. Arlyss Repressohini R Zaviyar Rahal Reason for Consultation:    Abdominal bloating and heartburn        HPI:   Alejandro Steele is a 73 y.o. male referred by Dr. Eli Phillipsenter[facility]  for consultation & management of abdominal bloating and heartburn  Abdominal bloating: Patient reports history of abdominal bloating for long time.  When he is physically, he said it has been a "long time" and his PCP referred him here for further evaluation.  He tried Bentyl and omeprazole 20 mg which did not provide much relief.  He feels full, also complaining of heartburn as well as regurgitation of food.  He reports having regular bowel movements, stays active, walks 3 blocks a day.  During this pandemic, he thinks he gained weight and has been eating more carbohydrates.  He loves cheese, butter and eats regularly.  He denies abdominal pain, nausea, vomiting, diarrhea, rectal bleeding.  Labs from 04/2019 were unremarkable including CBC, LFTs.   NSAIDs: None  Antiplts/Anticoagulants/Anti thrombotics: None  GI Procedures:  Colonoscopy 12/22/2013 by Dr. Servando SnareWohl for hematochezia 10 polyps measuring anywhere from 3 to 10 mm in size in ascending colon, transverse colon and descending colon were resected and retrieved Diverticulosis in the sigmoid colon Nonbleeding internal hemorrhoids  Pathology consistent with tubular adenoma follow polyps without any dysplasia  Past Medical History:  Diagnosis Date  . BPH with obstruction/lower urinary tract symptoms   . Erectile dysfunction   . Gout   . Hemorrhoid   . Hypertension   . Nocturia   . Urinary frequency   . Urinary urgency     Past Surgical History:  Procedure  Laterality Date  . COLONOSCOPY      Current Outpatient Medications:  .  atenolol (TENORMIN) 50 MG tablet, Take 50 mg by mouth daily., Disp: , Rfl:  .  finasteride (PROSCAR) 5 MG tablet, Take 1 tablet (5 mg total) by mouth daily., Disp: 90 tablet, Rfl: 3 .  MITIGARE 0.6 MG CAPS, , Disp: , Rfl:  .  pravastatin (PRAVACHOL) 20 MG tablet, , Disp: , Rfl:  .  tamsulosin (FLOMAX) 0.4 MG CAPS capsule, Take 1 capsule (0.4 mg total) by mouth daily., Disp: 90 capsule, Rfl: 3 .  tolterodine (DETROL LA) 4 MG 24 hr capsule, Take 1 capsule (4 mg total) by mouth daily., Disp: 90 capsule, Rfl: 3 .  tolterodine (DETROL LA) 4 MG 24 hr capsule, Take 1 capsule (4 mg total) by mouth daily., Disp: 30 capsule, Rfl: 11 .  dicyclomine (BENTYL) 20 MG tablet, , Disp: , Rfl:  .  omeprazole (PRILOSEC) 40 MG capsule, Take 1 capsule (40 mg total) by mouth daily before breakfast., Disp: 30 capsule, Rfl: 1    Family History  Problem Relation Age of Onset  . Stroke Father   . Diabetes Mellitus II Mother   . Diabetes Unknown   . Cancer Neg Hx        Kidney,Prostate,Bladder     Social History   Tobacco Use  . Smoking status: Former Games developermoker  . Smokeless tobacco: Never Used  . Tobacco comment: quit 20 years  Substance Use Topics  . Alcohol use: No    Alcohol/week: 0.0 standard  drinks    Comment: Used to drink moderately. Quit three years ago  . Drug use: No    Allergies as of 07/12/2019 - Review Complete 07/12/2019  Allergen Reaction Noted  . Penicillins  05/08/2015    Review of Systems:    All systems reviewed and negative except where noted in HPI.   Physical Exam:  BP (!) 141/82   Pulse 66   Temp 97.7 F (36.5 C) (Oral)   Ht 5\' 8"  (1.727 m)   Wt 207 lb 12.8 oz (94.3 kg)   BMI 31.60 kg/m  No LMP for male patient.  General:   Alert,  Well-developed, well-nourished, pleasant and cooperative in NAD Head:  Normocephalic and atraumatic. Eyes:  Sclera clear, no icterus.   Conjunctiva pink. Ears:   Normal auditory acuity. Nose:  No deformity, discharge, or lesions. Mouth:  No deformity or lesions,oropharynx pink & moist. Neck:  Supple; no masses or thyromegaly. Lungs:  Respirations even and unlabored.  Clear throughout to auscultation.   No wheezes, crackles, or rhonchi. No acute distress. Heart:  Regular rate and rhythm; no murmurs, clicks, rubs, or gallops. Abdomen:  Normal bowel sounds. Soft, non-tender and non-distended without masses, hepatosplenomegaly or hernias noted.  No guarding or rebound tenderness.   Rectal: Not performed Msk:  Symmetrical without gross deformities. Good, equal movement & strength bilaterally. Pulses:  Normal pulses noted. Extremities:  No clubbing or edema.  No cyanosis. Neurologic:  Alert and oriented x3;  grossly normal neurologically. Skin:  Intact without significant lesions or rashes. No jaundice. Psych:  Alert and cooperative. Normal mood and affect.  Imaging Studies: Reviewed  Assessment and Plan:   Alejandro Steele is a 73 y.o. male with no significant past medical history is seen in consultation for chronic abdominal bloating, heartburn and regurgitation  Abdominal bloating Advised him about lactose-free diet Rule out H. pylori Discussed with him about upper endoscopy but patient prefers to have noninvasive testing Ordered H. pylori IgG and H. pylori breath test  GERD Discussed EGD as above but patient prefers to try medication first Trial of omeprazole 40 mg daily before breakfast Antireflux lifestyle discussed, information provided  History of tubular adenomas of colon Discussed with patient that he is overdue for surveillance colonoscopy.  He is concerned about paying medical bills for his urologic work-up.  He is not ready to undergo next colonoscopy yet.  Reiterated to him about importance of undergoing surveillance colonoscopy and not to delay it longer   Follow up in 1 month   Cephas Darby, MD

## 2019-07-13 LAB — H. PYLORI BREATH TEST: H pylori Breath Test: NEGATIVE

## 2019-07-13 LAB — H. PYLORI ANTIBODY, IGG: H. pylori, IgG AbS: 1.6 Index Value — ABNORMAL HIGH (ref 0.00–0.79)

## 2019-07-14 ENCOUNTER — Other Ambulatory Visit: Payer: Self-pay

## 2019-07-14 MED ORDER — CLARITHROMYCIN 250 MG PO TABS: 250.0000 mg | ORAL_TABLET | Freq: Two times a day (BID) | ORAL | 0 refills | Status: AC

## 2019-07-14 MED ORDER — AMOXICILLIN 500 MG PO TABS: 1000.0000 mg | ORAL_TABLET | Freq: Two times a day (BID) | ORAL | 0 refills | Status: AC

## 2019-07-14 MED ORDER — OMEPRAZOLE 40 MG PO CPDR: 40.0000 mg | DELAYED_RELEASE_CAPSULE | Freq: Two times a day (BID) | ORAL | 0 refills | Status: DC

## 2019-07-22 ENCOUNTER — Other Ambulatory Visit: Payer: Self-pay

## 2019-07-22 ENCOUNTER — Ambulatory Visit
Admission: RE | Admit: 2019-07-22 | Discharge: 2019-07-22 | Disposition: A | Discharge status: Home / Self Care | Payer: Medicare Other | Source: Ambulatory Visit | Attending: Urology | Admitting: Urology

## 2019-07-22 DIAGNOSIS — R3129 Other microscopic hematuria: Secondary | ICD-10-CM | POA: Diagnosis not present

## 2019-07-22 LAB — POCT I-STAT CREATININE: Creatinine, Ser: 1.2 mg/dL (ref 0.61–1.24)

## 2019-07-22 MED ORDER — IOHEXOL 300 MG/ML  SOLN
125.0000 mL | Freq: Once | INTRAMUSCULAR | Status: AC | PRN
  Administered 2019-07-22: 125 mL via INTRAVENOUS

## 2019-07-28 ENCOUNTER — Other Ambulatory Visit: Payer: Medicare Other | Admitting: Urology

## 2019-08-02 ENCOUNTER — Telehealth: Payer: Self-pay

## 2019-08-02 NOTE — Telephone Encounter (Signed)
-----   Message from Billey Co, MD sent at 08/02/2019  9:05 AM EDT ----- No urologic abnormalities on CT to explain his microscopic hematuria.  Sharyn Lull- please forward the imaging results to his PCP for review re: duodenem/pancreas findings. Thanks  Nickolas Madrid, MD 08/02/2019

## 2019-08-02 NOTE — Telephone Encounter (Signed)
Called pt using interpreter services (see ID # above) no answer. 1st attempt

## 2019-08-04 NOTE — Telephone Encounter (Signed)
ID #[id number]  Attempted to call 3 times with interpreter no answer could not leave vmail.  Per DPR patient can be notified on vmail

## 2019-08-10 ENCOUNTER — Encounter: Payer: Self-pay | Admitting: Gastroenterology

## 2019-08-22 NOTE — Telephone Encounter (Signed)
Letter mailed to patient in [place].

## 2019-08-23 ENCOUNTER — Encounter (INDEPENDENT_AMBULATORY_CARE_PROVIDER_SITE_OTHER): Payer: Self-pay

## 2019-08-23 ENCOUNTER — Other Ambulatory Visit: Payer: Self-pay

## 2019-08-23 ENCOUNTER — Ambulatory Visit (INDEPENDENT_AMBULATORY_CARE_PROVIDER_SITE_OTHER): Payer: Medicare Other | Admitting: Gastroenterology

## 2019-08-23 ENCOUNTER — Encounter: Payer: Self-pay | Admitting: Gastroenterology

## 2019-08-23 VITALS — BP 144/88 | HR 67 | Temp 97.5°F | Ht 68.0 in | Wt 207.2 lb

## 2019-08-23 DIAGNOSIS — K219 Gastro-esophageal reflux disease without esophagitis: Secondary | ICD-10-CM | POA: Diagnosis not present

## 2019-08-23 DIAGNOSIS — R933 Abnormal findings on diagnostic imaging of other parts of digestive tract: Secondary | ICD-10-CM | POA: Diagnosis not present

## 2019-08-23 MED ORDER — OMEPRAZOLE 40 MG PO CPDR: 40.0000 mg | DELAYED_RELEASE_CAPSULE | Freq: Every day | ORAL | 0 refills | Status: DC

## 2019-08-23 NOTE — Progress Notes (Signed)
Cephas Darby, MD [address]  [place]  [place], [place] [address]  Main: [phone]  Fax: [phone]    Gastroenterology Consultation  Referring Provider:     [facility] Physician:  [facility] [place] Primary Gastroenterologist:  Dr. Cephas Darby Reason for Consultation:    Abdominal bloating and heartburn        HPI:   Alejandro Steele is a 73 y.o. male referred by Dr. Domingo Madeira, [place]  for consultation & management of abdominal bloating and heartburn  Abdominal bloating: Patient reports history of abdominal bloating for long time.  When he is physically, he said it has been a "long time" and his PCP referred him here for further evaluation.  He tried Bentyl and omeprazole 20 mg which did not provide much relief.  He feels full, also complaining of heartburn as well as regurgitation of food.  He reports having regular bowel movements, stays active, walks 3 blocks a day.  During this pandemic, he thinks he gained weight and has been eating more carbohydrates.  He loves cheese, butter and eats regularly.  He denies abdominal pain, nausea, vomiting, diarrhea, rectal bleeding.  Labs from 04/2019 were unremarkable including CBC, LFTs.   Follow-up visit 08/23/2019 Patient's H. pylori IgG was positive.  Therefore, I treated him empirically for H. pylori with triple therapy which he finished the course.  He had a CT hematuria work-up which incidentally showed small fluid collection posterior to the descending duodenum and pancreatic head.  There was no obvious duodenal ulcer, pancreatic lesion. Patient today reports that his abdominal bloating has improved but not totally resolved.  Coffee triggers his heartburn.  He is currently not on antireflux medication.  He reports his bowel movements are regular.  NSAIDs: None  Antiplts/Anticoagulants/Anti thrombotics: None  GI Procedures:  Colonoscopy 12/22/2013 by  Dr. Allen Norris for hematochezia 10 polyps measuring anywhere from 3 to 10 mm in size in ascending colon, transverse colon and descending colon were resected and retrieved Diverticulosis in the sigmoid colon Nonbleeding internal hemorrhoids  Pathology consistent with tubular adenoma follow polyps without any dysplasia  Past Medical History:  Diagnosis Date  . BPH with obstruction/lower urinary tract symptoms   . Erectile dysfunction   . Gout   . Hemorrhoid   . Hypertension   . Nocturia   . Urinary frequency   . Urinary urgency     Past Surgical History:  Procedure Laterality Date  . COLONOSCOPY      Current Outpatient Medications:  .  amoxicillin (AMOXIL) 250 MG/5ML suspension, Take 250 mg by mouth 2 (two) times daily., Disp: , Rfl:  .  atenolol (TENORMIN) 50 MG tablet, Take 50 mg by mouth daily., Disp: , Rfl:  .  clarithromycin (BIAXIN) 250 MG tablet, Take 250 mg by mouth 2 (two) times daily at 10 am and 4 pm., Disp: , Rfl:  .  dicyclomine (BENTYL) 20 MG tablet, , Disp: , Rfl:  .  finasteride (PROSCAR) 5 MG tablet, Take 1 tablet (5 mg total) by mouth daily., Disp: 90 tablet, Rfl: 3 .  MITIGARE 0.6 MG CAPS, , Disp: , Rfl:  .  pravastatin (PRAVACHOL) 20 MG tablet, , Disp: , Rfl:  .  tamsulosin (FLOMAX) 0.4 MG CAPS capsule, Take 1 capsule (0.4 mg total) by mouth daily., Disp: 90 capsule, Rfl: 3 .  tolterodine (DETROL LA) 4 MG 24 hr capsule, Take 1 capsule (4 mg total) by mouth daily.,  Disp: 90 capsule, Rfl: 3 .  omeprazole (PRILOSEC) 40 MG capsule, Take 1 capsule (40 mg total) by mouth daily before breakfast., Disp: 90 capsule, Rfl: 0 .  tolterodine (DETROL LA) 4 MG 24 hr capsule, Take 1 capsule (4 mg total) by mouth daily. (Patient not taking: Reported on 08/23/2019), Disp: 30 capsule, Rfl: 11    Family History  Problem Relation Age of Onset  . Stroke Father   . Diabetes Mellitus II Mother   . Diabetes Other   . Cancer Neg Hx        Kidney,Prostate,Bladder     Social History    Tobacco Use  . Smoking status: Former Games developer  . Smokeless tobacco: Never Used  . Tobacco comment: quit 20 years  Substance Use Topics  . Alcohol use: No    Alcohol/week: 0.0 standard drinks    Comment: Used to drink moderately. Quit three years ago  . Drug use: No    Allergies as of 08/23/2019 - Review Complete 08/23/2019  Allergen Reaction Noted  . Penicillins  05/08/2015    Review of Systems:    All systems reviewed and negative except where noted in HPI.   Physical Exam:  BP (!) 144/88   Pulse 67   Temp (!) 97.5 F (36.4 C) (Oral)   Ht 5\' 8"  (1.727 m)   Wt 207 lb 3.2 oz (94 kg)   BMI 31.50 kg/m  No LMP for male patient.  General:   Alert,  Well-developed, well-nourished, pleasant and cooperative in NAD Head:  Normocephalic and atraumatic. Eyes:  Sclera clear, no icterus.   Conjunctiva pink. Ears:  Normal auditory acuity. Nose:  No deformity, discharge, or lesions. Mouth:  No deformity or lesions,oropharynx pink & moist. Neck:  Supple; no masses or thyromegaly. Lungs:  Respirations even and unlabored.  Clear throughout to auscultation.   No wheezes, crackles, or rhonchi. No acute distress. Heart:  Regular rate and rhythm; no murmurs, clicks, rubs, or gallops. Abdomen:  Normal bowel sounds. Soft, non-tender and non-distended without masses, hepatosplenomegaly or hernias noted.  No guarding or rebound tenderness.   Rectal: Not performed Msk:  Symmetrical without gross deformities. Good, equal movement & strength bilaterally. Pulses:  Normal pulses noted. Extremities:  No clubbing or edema.  No cyanosis. Neurologic:  Alert and oriented x3;  grossly normal neurologically. Skin:  Intact without significant lesions or rashes. No jaundice. Psych:  Alert and cooperative. Normal mood and affect.  Imaging Studies: Reviewed  Assessment and Plan:   Alejandro Steele is a 73 y.o. male with no significant past medical history is seen in consultation for chronic  abdominal bloating, heartburn and regurgitation  Abdominal bloating Advised him about lactose-free diet Status post triple therapy for positive H. pylori IgG given his symptoms Reviewed CT scan results with patient today and recommend to undergo upper endoscopy for further evaluation.  Patient said he will call and schedule the endoscopy after confirming the availability of his ride for the procedure.  I also told him that if EGD is negative, he will need upper EUS  GERD Restart omeprazole 40 mg daily before breakfast Continue antireflux lifestyle Avoid coffee  History of tubular adenomas of colon Again, discussed with patient today that he is overdue for surveillance colonoscopy.  Recommend colonoscopy along with upper endoscopy which she is willing to undergo once he confirms availability of his ride   Follow up in 2 months   66, MD

## 2019-08-26 ENCOUNTER — Telehealth: Payer: Self-pay

## 2019-08-26 NOTE — Telephone Encounter (Signed)
Called the [place] interpreter. Pat answered with this service. She states that when she tried to call patient the number was disconnected.  We were trying to call to see if patient was ready to scheduled the colonoscopy and EGD.

## 2019-08-26 NOTE — Telephone Encounter (Signed)
-----   Message from Lin Landsman, MD sent at 08/23/2019  9:56 AM EDT ----- Regarding: EGD and colonoscopy Please follow-up with him next week to see if he is ready for procedures  Thanks RV

## 2019-09-26 ENCOUNTER — Telehealth: Payer: Self-pay | Admitting: Urology

## 2019-09-26 NOTE — Telephone Encounter (Signed)
Pt needs a refill for Flomax and would like a 3 month supply sent to [facility] on [address]

## 2019-09-26 NOTE — Telephone Encounter (Signed)
Spoke with family member and clarified medications.  Per last office note patient should be taking proscar and detrol.  Patient aware.

## 2019-12-01 ENCOUNTER — Other Ambulatory Visit: Payer: Self-pay

## 2019-12-01 DIAGNOSIS — K219 Gastro-esophageal reflux disease without esophagitis: Secondary | ICD-10-CM

## 2019-12-01 MED ORDER — OMEPRAZOLE 40 MG PO CPDR: 40.0000 mg | DELAYED_RELEASE_CAPSULE | Freq: Every day | ORAL | 0 refills | Status: AC

## 2019-12-06 IMAGING — US US PELVIS LIMITED
1 series · 14 of 21 positions shown · non-contrast
Comparison: None.

CLINICAL DATA: Left lower quadrant pain

EXAM:
LIMITED ULTRASOUND OF PELVIS
TECHNIQUE: Limited transabdominal ultrasound examination of the pelvis was
performed.

[Series 1: us pelvis limited · 0.09mm/px · 14 of 21 slices shown]
[im 1/21]
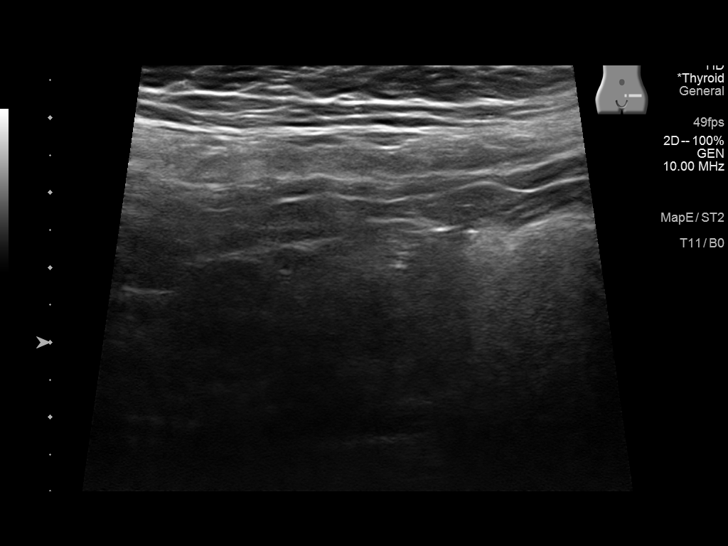
[im 3/21]
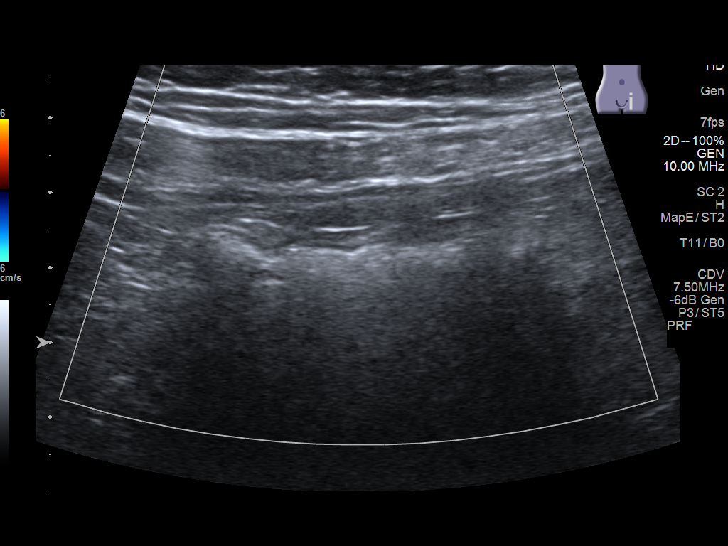
[im 4/21]
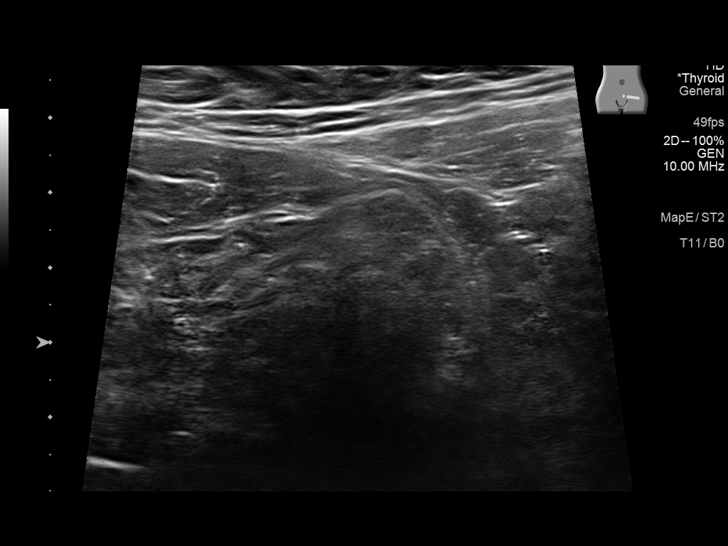
[im 6/21]
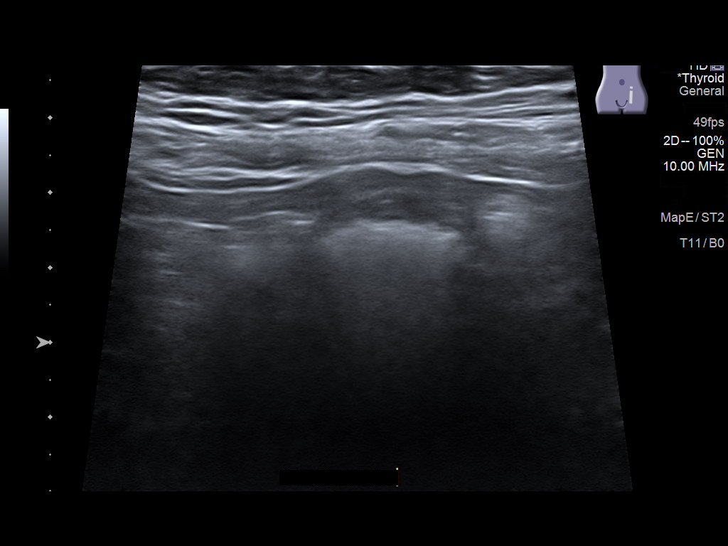
[im 7/21]
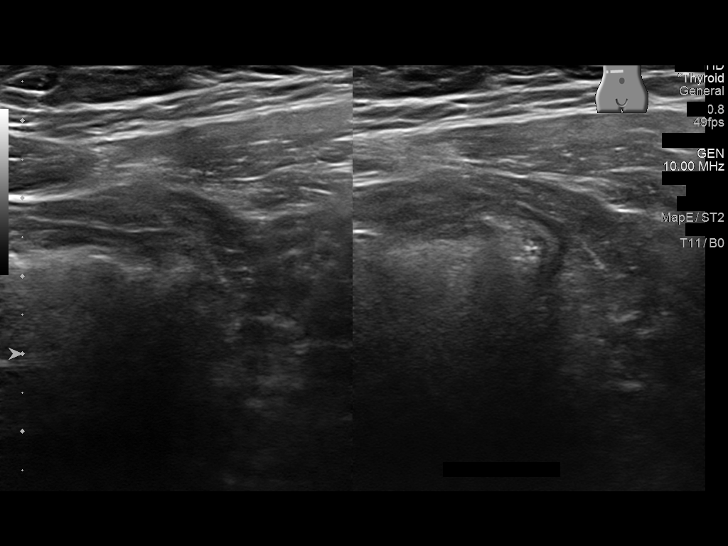
[im 9/21]
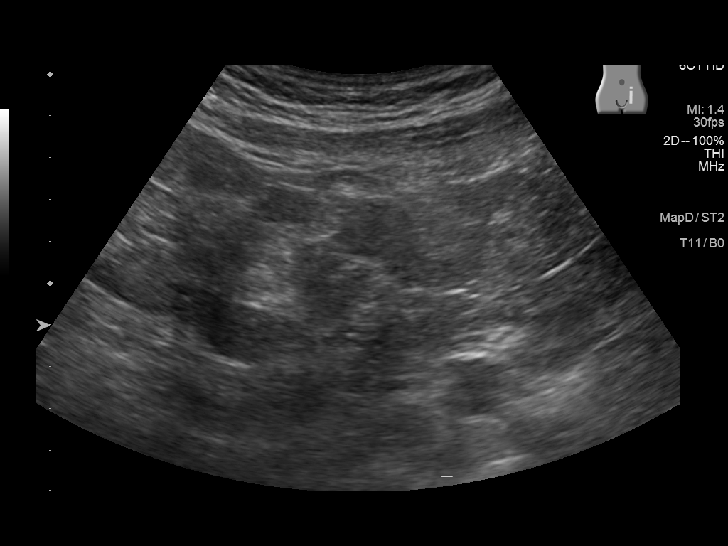
[im 10/21]
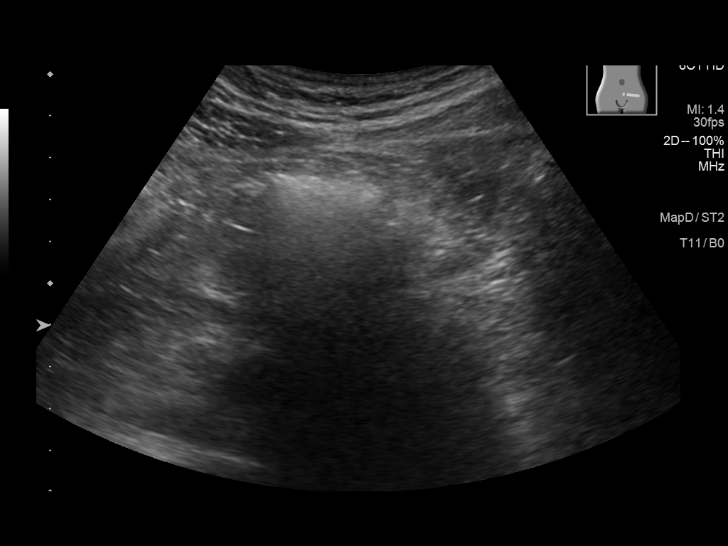
[im 12/21]
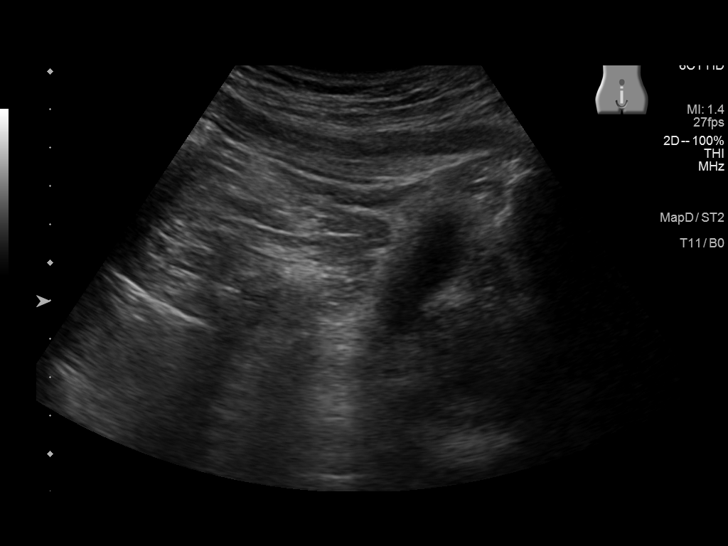
[im 13/21]
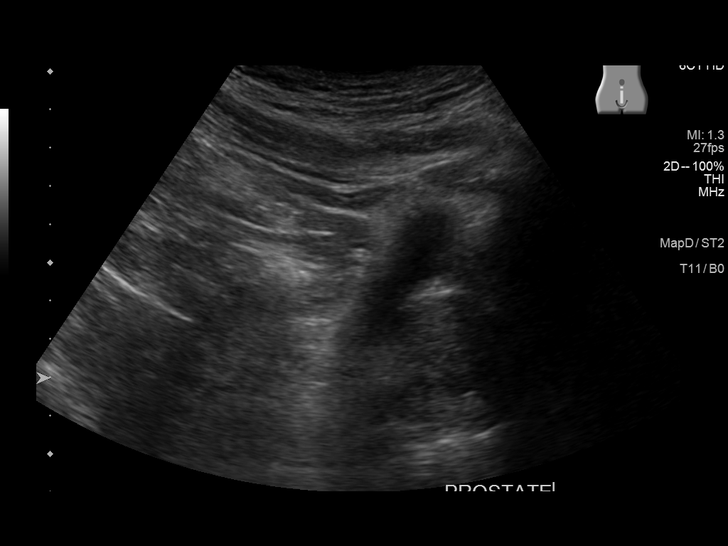
[im 15/21]
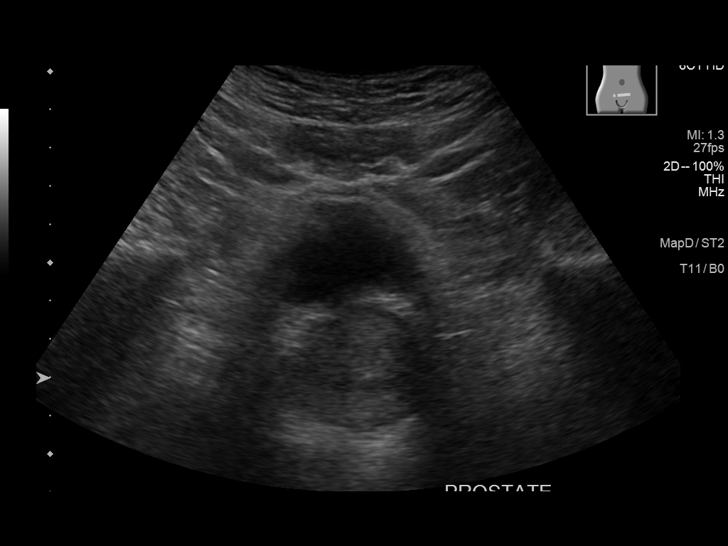
[im 16/21]
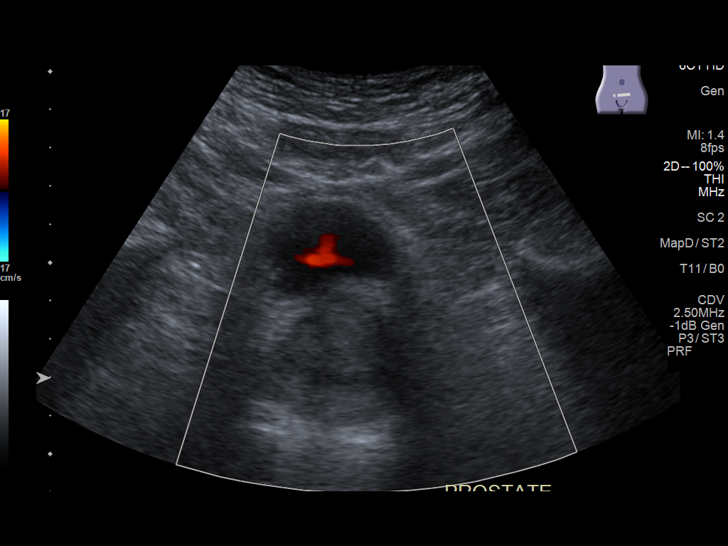
[im 18/21]
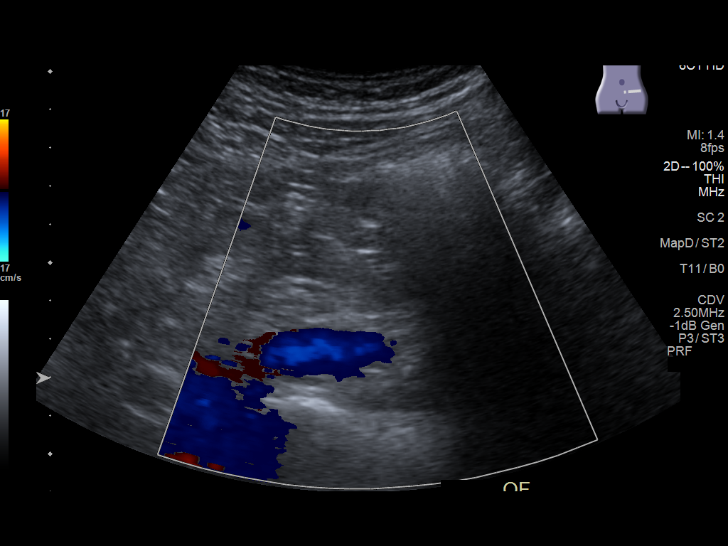
[im 19/21]
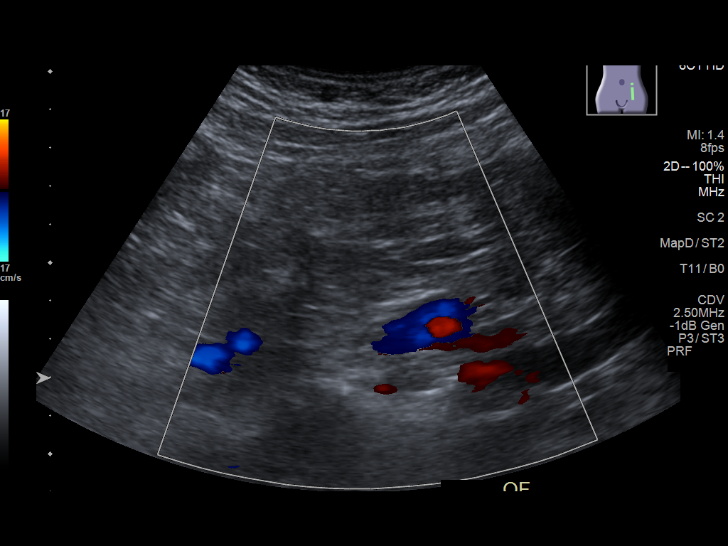
[im 21/21]
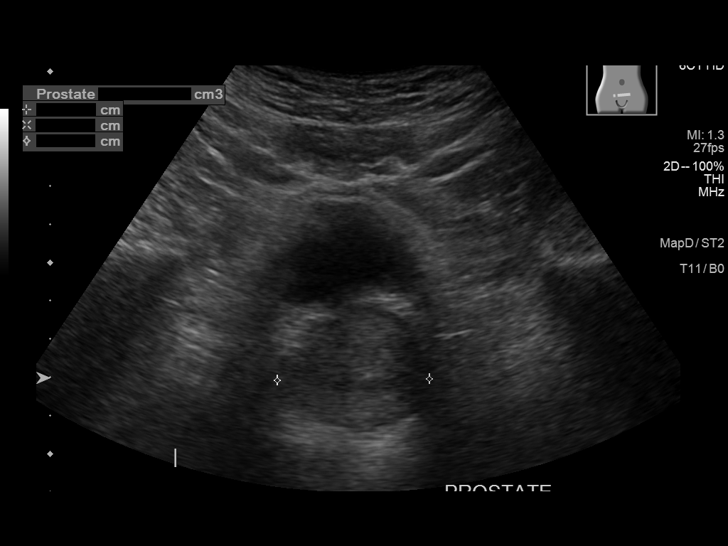

[14 of 21 positions shown; findings below may reference images not displayed]

FINDINGS: Ultrasound performed in the left lower quadrant and midline of the
pelvis. No hernia visualized. No mass seen. Prostate visualized,
grossly unremarkable. Urinary bladder is decompressed.
IMPRESSION: No visible mass or hernia.

## 2020-02-06 ENCOUNTER — Telehealth: Payer: Self-pay | Admitting: Urology

## 2020-02-06 NOTE — Telephone Encounter (Signed)
Pt asking for a refill of his Detrol to be sent to [facility] on [address] .

## 2020-02-07 ENCOUNTER — Other Ambulatory Visit: Payer: Self-pay

## 2020-02-07 ENCOUNTER — Other Ambulatory Visit: Payer: Self-pay | Admitting: Urology

## 2020-02-07 DIAGNOSIS — N138 Other obstructive and reflux uropathy: Secondary | ICD-10-CM

## 2020-02-07 DIAGNOSIS — N3281 Overactive bladder: Secondary | ICD-10-CM

## 2020-02-07 DIAGNOSIS — N401 Enlarged prostate with lower urinary tract symptoms: Secondary | ICD-10-CM

## 2020-02-07 MED ORDER — TOLTERODINE TARTRATE ER 4 MG PO CP24: 4.0000 mg | ORAL_CAPSULE | Freq: Every day | ORAL | 11 refills | Status: DC

## 2020-02-07 NOTE — Telephone Encounter (Signed)
Done  Legrand Rams, MD 02/07/2020

## 2020-02-24 IMAGING — CT CT ABD-PEL WO/W CM
3 of 12 series · 11 of 46 positions shown, 17 images · IV contrast (omnipaque)
Comparison: None.

CLINICAL DATA: Microhematuria with bilateral flank pain. Urinary
frequency.

EXAM:
CT ABDOMEN AND PELVIS WITHOUT AND WITH CONTRAST
TECHNIQUE: Multidetector CT imaging of the abdomen and pelvis was performed
following the standard protocol before and following the bolus
administration of intravenous contrast.
CONTRAST:  125mL OMNIPAQUE IOHEXOL 300 MG/ML  SOLN

[Series 2: axial pre · axial · non-contrast · 0.85mm/px · z∈[-70,+290]mm · 7 of 96 slices shown, 12 images]
[im 12/96  soft-tissue]
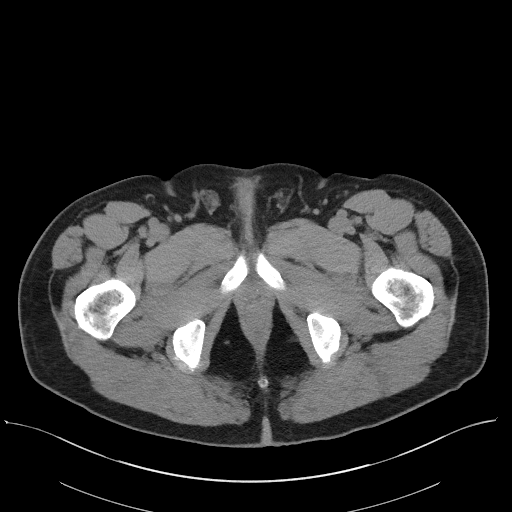
[im 12/96  bone]
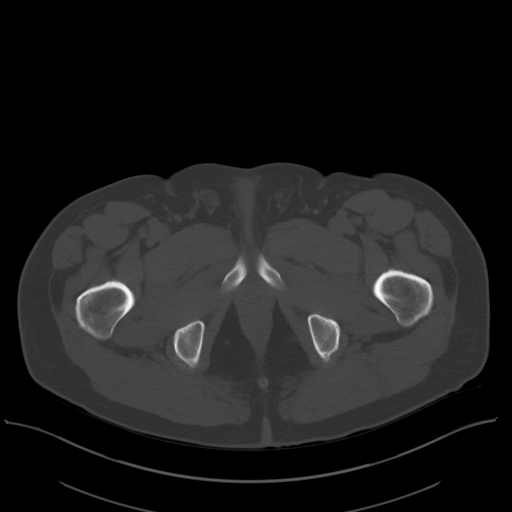
[im 24/96  soft-tissue]
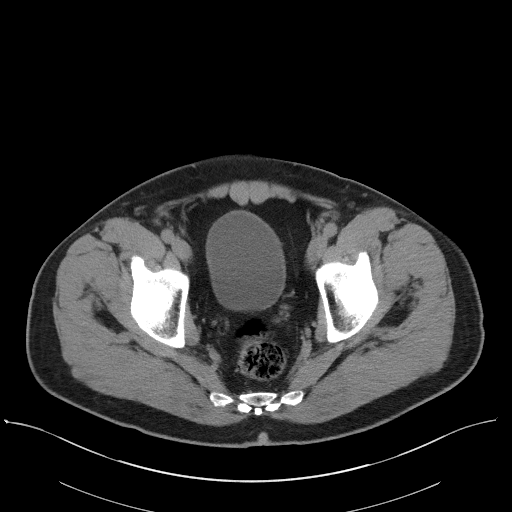
[im 36/96  soft-tissue]
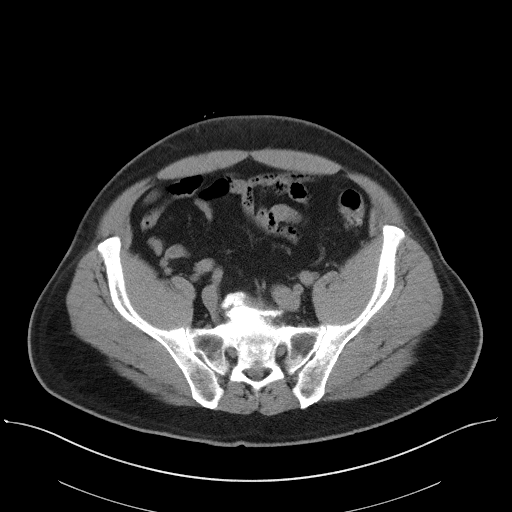
[im 48/96  soft-tissue]
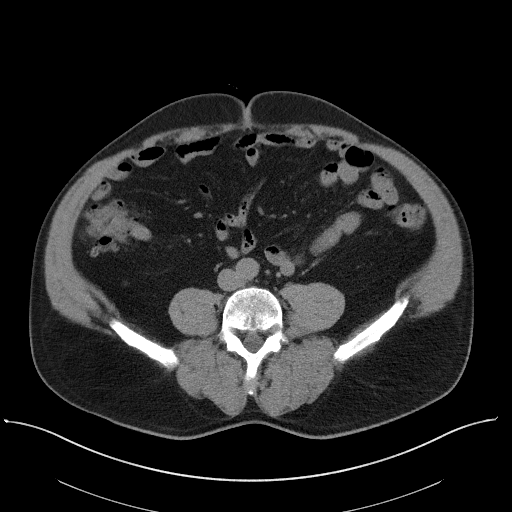
[im 48/96  lung]
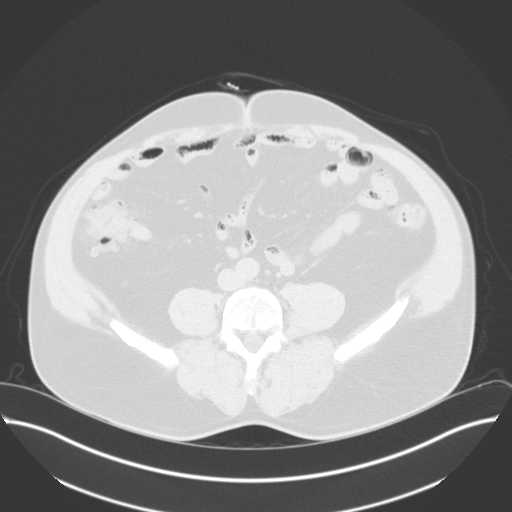
[im 60/96  soft-tissue]
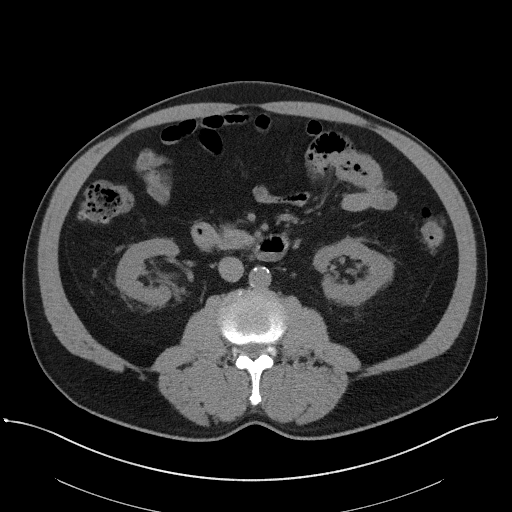
[im 60/96  lung]
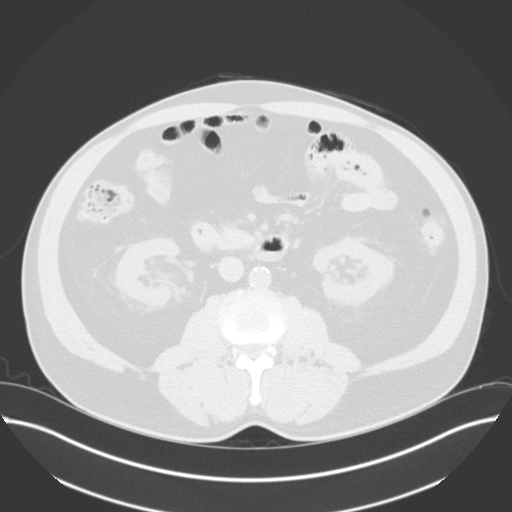
[im 72/96  soft-tissue]
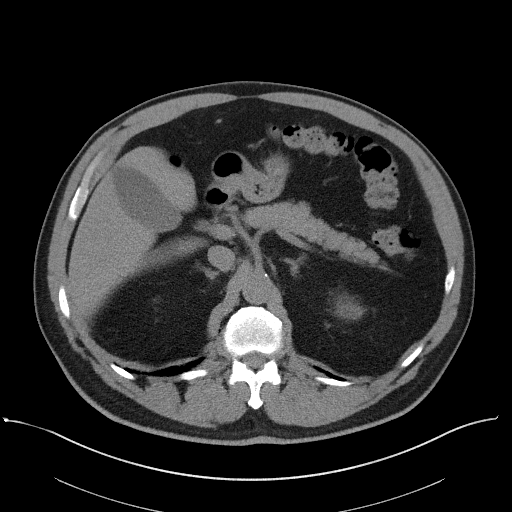
[im 72/96  lung]
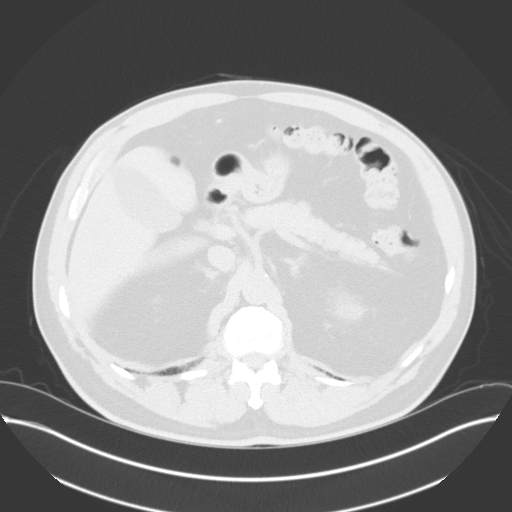
[im 84/96  soft-tissue]
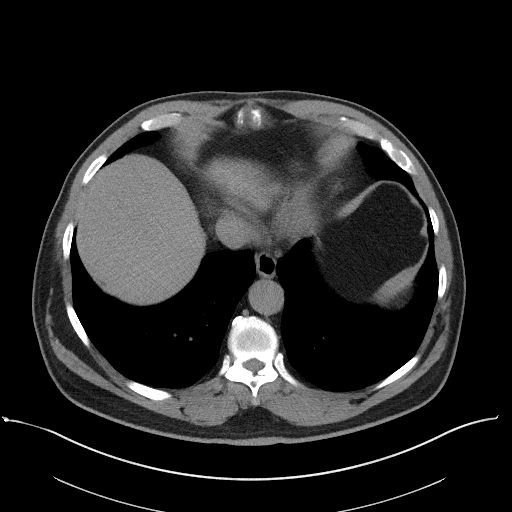
[im 84/96  lung]
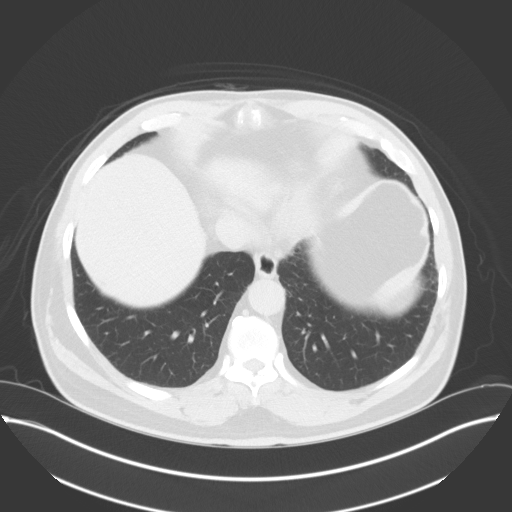

[Series 4: axial post · axial · 0.85mm/px · z∈[-60,+75]mm · 3 of 96 slices shown]
[im 14/96  soft-tissue]
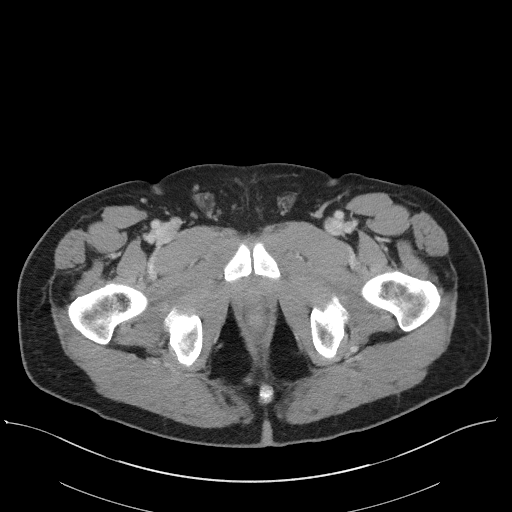
[im 28/96  soft-tissue]
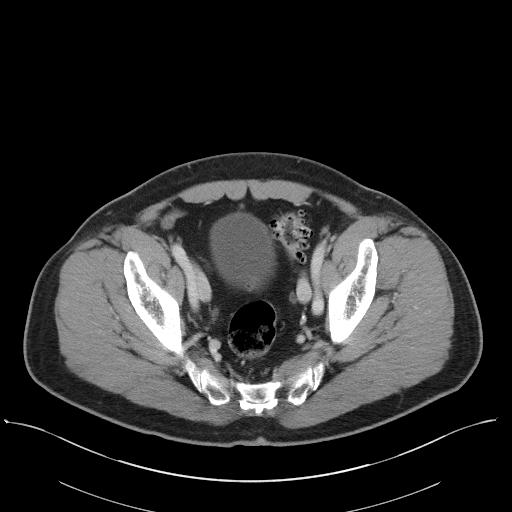
[im 41/96  soft-tissue]
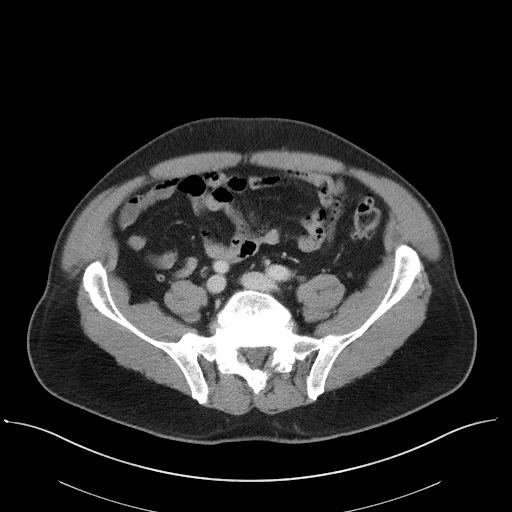

[Series 16: coronal delay · coronal · delayed · 0.79mm/px · 1 of 102 slices shown, 2 images]
[im 51/102  soft-tissue]
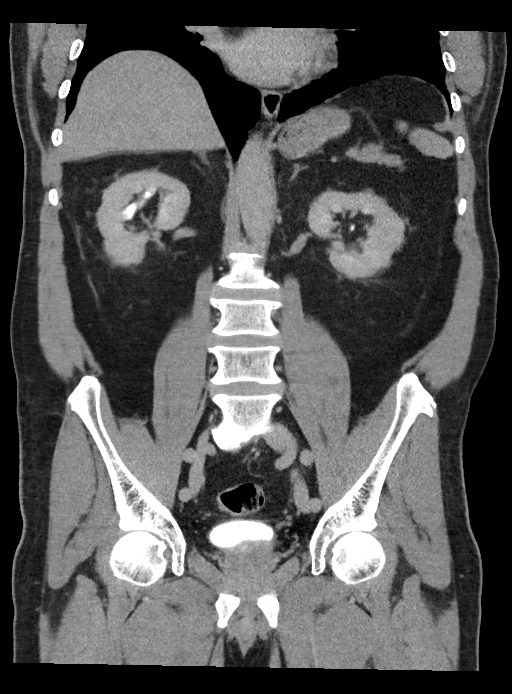
[im 51/102  bone]
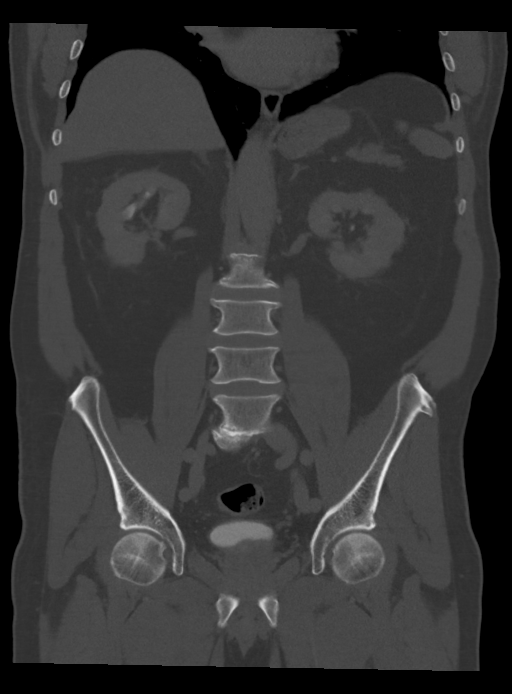

[11 of 46 positions shown; findings below may reference images not displayed]

FINDINGS: Lower chest: Unremarkable.

Hepatobiliary: No suspicious focal abnormality within the liver
parenchyma. There is no evidence for gallstones, gallbladder wall
thickening, or pericholecystic fluid. No intrahepatic or
extrahepatic biliary dilation.

Pancreas: No focal mass lesion. No dilatation of the main duct. No
intraparenchymal cyst. There is a collection of fluid attenuation in
the retroperitoneal space posterior to the pancreatic head and
descending duodenum (image 33/series 4). No evidence for edema
within the pancreatic head parenchyma.

Spleen: No splenomegaly. No focal mass lesion.

Adrenals/Urinary Tract: No adrenal nodule or mass.

Precontrast imaging shows no stones in either kidney or ureter. No
bladder stones. No secondary changes in either kidney or ureter.

Imaging after IV contrast administration reveals no enhancing lesion
in either kidney.

Delayed post-contrast imaging shows no wall thickening or soft
tissue filling defect in either intrarenal collecting system or
renal pelvis. Both ureters are well opacified without evidence for
wall thickening, soft tissue lesion or focal dilatation. Delayed
imaging of the bladder shows no focal wall thickening.

Stomach/Bowel: Stomach is unremarkable. No gastric wall thickening.
No evidence of outlet obstruction. As noted above, there is a small
collection of fluid attenuation measuring 4.5 x 2.5 x 1.8 cm and
tracking immediately posterior to the descending duodenum and
pancreatic head. No duodenal wall thickening evident. No small bowel
wall thickening. No small bowel dilatation. The terminal ileum is
normal. The appendix is normal. No gross colonic mass. No colonic
wall thickening. Diverticuli are seen scattered along the entire
length of the colon without CT findings of diverticulitis.

Vascular/Lymphatic: There is abdominal aortic atherosclerosis
without aneurysm. There is no gastrohepatic or hepatoduodenal
ligament lymphadenopathy. No intraperitoneal or retroperitoneal
lymphadenopathy. No pelvic sidewall lymphadenopathy.

Reproductive: The prostate gland and seminal vesicles are
unremarkable.

Other: No intraperitoneal free fluid.

Musculoskeletal: No worrisome lytic or sclerotic osseous
abnormality. Bilateral pars interarticularis defects noted at L5.
IMPRESSION: 1. No findings to explain the patient's history of microhematuria
with bilateral flank pain and urinary frequency.
2. Small collection of fluid attenuation posterior to the descending
duodenum and pancreatic head. Duodenitis/duodenal ulcer or groove
pancreatitis would be considerations although there is no duodenal
wall thickening or pancreatic head edema to support this. Imaging
features are not characteristic for a duodenal diverticulum.
Infiltrative pancreatic adenocarcinoma or lymphoma considered
unlikely but cannot be excluded. I am not sure a dedicated of
abdominal MRI would provide much additional characterization. EUS
may prove helpful or consider short-term interval follow-up
pancreatic protocol CT in 6-12 weeks to ensure stability.

## 2020-05-07 ENCOUNTER — Other Ambulatory Visit: Payer: Self-pay

## 2020-05-07 DIAGNOSIS — N138 Other obstructive and reflux uropathy: Secondary | ICD-10-CM

## 2020-05-07 MED ORDER — FINASTERIDE 5 MG PO TABS: 5.0000 mg | ORAL_TABLET | Freq: Every day | ORAL | 0 refills | Status: DC

## 2020-09-04 ENCOUNTER — Other Ambulatory Visit: Payer: Self-pay | Admitting: Urology

## 2020-09-04 DIAGNOSIS — N138 Other obstructive and reflux uropathy: Secondary | ICD-10-CM

## 2020-09-04 DIAGNOSIS — N401 Enlarged prostate with lower urinary tract symptoms: Secondary | ICD-10-CM

## 2020-10-14 ENCOUNTER — Other Ambulatory Visit: Payer: Self-pay | Admitting: Urology

## 2020-10-14 DIAGNOSIS — N401 Enlarged prostate with lower urinary tract symptoms: Secondary | ICD-10-CM

## 2021-01-12 ENCOUNTER — Other Ambulatory Visit: Payer: Self-pay | Admitting: Urology

## 2021-01-12 DIAGNOSIS — N138 Other obstructive and reflux uropathy: Secondary | ICD-10-CM

## 2021-01-12 DIAGNOSIS — N401 Enlarged prostate with lower urinary tract symptoms: Secondary | ICD-10-CM

## 2021-04-03 ENCOUNTER — Other Ambulatory Visit: Payer: Self-pay

## 2021-04-03 ENCOUNTER — Ambulatory Visit (INDEPENDENT_AMBULATORY_CARE_PROVIDER_SITE_OTHER): Payer: Medicare Other | Admitting: Urology

## 2021-04-03 ENCOUNTER — Encounter: Payer: Self-pay | Admitting: Urology

## 2021-04-03 VITALS — BP 153/80 | HR 71 | Ht 68.0 in | Wt 192.4 lb

## 2021-04-03 DIAGNOSIS — R3121 Asymptomatic microscopic hematuria: Secondary | ICD-10-CM | POA: Diagnosis not present

## 2021-04-03 DIAGNOSIS — N138 Other obstructive and reflux uropathy: Secondary | ICD-10-CM | POA: Diagnosis not present

## 2021-04-03 DIAGNOSIS — N401 Enlarged prostate with lower urinary tract symptoms: Secondary | ICD-10-CM | POA: Diagnosis not present

## 2021-04-03 DIAGNOSIS — N3281 Overactive bladder: Secondary | ICD-10-CM

## 2021-04-03 MED ORDER — TOLTERODINE TARTRATE ER 4 MG PO CP24: 4.0000 mg | ORAL_CAPSULE | Freq: Every day | ORAL | 11 refills | Status: DC

## 2021-04-03 MED ORDER — GEMTESA 75 MG PO TABS: 75.0000 mg | ORAL_TABLET | Freq: Every day | ORAL | 6 refills | Status: DC

## 2021-04-03 NOTE — Addendum Note (Signed)
Addended by: Frankey Shown on: 04/03/2021 11:45 AM   Modules accepted: Orders

## 2021-04-03 NOTE — Progress Notes (Signed)
   04/03/2021 10:58 AM   Alejandro Steele 11-30-1945 [id number]  Reason for visit: Follow up OAB/BPH, history of microscopic hematuria  HPI: I saw Alejandro Steele back in urology clinic for his urinary symptoms.  He was followed by Dr. Sherryl Barters in the past, and his symptoms are urgency, frequency, and some urge incontinence.  He is currently on finasteride and Detrol 4 mg XL which is worked well for him.  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  Max flow was 7 mL/S, with detrusor pressure at peak flow of 19 cm H2O. He also drinks quite a bit of coffee and soda during the day which contributes to his urinary symptoms.  He is primarily here today for refills.  He thinks his urinary symptoms are stable to slightly worse.  He is interested in adding another medication if this may help.  We discussed vibegron and the risks and benefits.  At our last visit, he also had microscopic hematuria and I ordered a CT urogram which he completed and showed no urologic abnormalities, and prostate measured 35 g.  I personally viewed and interpreted those images.  He did not follow-up for the cystoscopy, however he had a negative cystoscopy in 2018 with Dr. Sherryl Barters.  Urinalysis today is benign with no microscopic hematuria.  Addition of vibegron 75 mg daily, sent to [facility] for PA Detrol and finasteride refilled RTC 6 months symptom check  Sondra Come, MD  [facility] [address] [place], [place] [address] [phone]

## 2021-04-05 LAB — URINALYSIS, COMPLETE
Bilirubin, UA: NEGATIVE
Glucose, UA: NEGATIVE
Leukocytes,UA: NEGATIVE
Nitrite, UA: NEGATIVE
Protein,UA: NEGATIVE
Specific Gravity, UA: 1.025 (ref 1.005–1.030)
Urobilinogen, Ur: 0.2 mg/dL (ref 0.2–1.0)
pH, UA: 5.5 (ref 5.0–7.5)

## 2021-04-05 LAB — MICROSCOPIC EXAMINATION
Bacteria, UA: NONE SEEN
Epithelial Cells (non renal): NONE SEEN /hpf (ref 0–10)

## 2021-04-10 ENCOUNTER — Ambulatory Visit: Payer: Medicare Other | Admitting: Urology

## 2021-10-09 ENCOUNTER — Ambulatory Visit: Payer: Medicare Other | Admitting: Urology

## 2022-03-14 ENCOUNTER — Encounter: Payer: Self-pay | Admitting: Physician Assistant

## 2022-03-14 ENCOUNTER — Ambulatory Visit (INDEPENDENT_AMBULATORY_CARE_PROVIDER_SITE_OTHER): Payer: Medicare HMO | Admitting: Physician Assistant

## 2022-03-14 VITALS — BP 139/82 | HR 93 | Ht 68.0 in | Wt 187.0 lb

## 2022-03-14 DIAGNOSIS — N3281 Overactive bladder: Secondary | ICD-10-CM | POA: Diagnosis not present

## 2022-03-14 DIAGNOSIS — N138 Other obstructive and reflux uropathy: Secondary | ICD-10-CM

## 2022-03-14 DIAGNOSIS — N401 Enlarged prostate with lower urinary tract symptoms: Secondary | ICD-10-CM | POA: Diagnosis not present

## 2022-03-14 LAB — URINALYSIS, COMPLETE
Bilirubin, UA: NEGATIVE
Glucose, UA: NEGATIVE
Ketones, UA: NEGATIVE
Leukocytes,UA: NEGATIVE
Nitrite, UA: NEGATIVE
Protein,UA: NEGATIVE
RBC, UA: NEGATIVE
Specific Gravity, UA: 1.02 (ref 1.005–1.030)
Urobilinogen, Ur: 0.2 mg/dL (ref 0.2–1.0)
pH, UA: 6 (ref 5.0–7.5)

## 2022-03-14 LAB — MICROSCOPIC EXAMINATION: Bacteria, UA: NONE SEEN

## 2022-03-14 LAB — BLADDER SCAN AMB NON-IMAGING: Scan Result: 5

## 2022-03-14 MED ORDER — TOLTERODINE TARTRATE ER 4 MG PO CP24: 4.0000 mg | ORAL_CAPSULE | Freq: Every day | ORAL | 11 refills | Status: DC

## 2022-03-14 NOTE — Progress Notes (Signed)
? ?03/14/2022 ?10:38 AM  ? ?Alejandro Steele ?[birth year] ?[id number] ? ?CC: ?No chief complaint on file. ? ?HPI: ?Alejandro Steele is a 76 y.o. male with PMH BPH, OAB, and microscopic hematuria previously on Gemtesa, tolterodine, and finasteride who presents today for annual follow-up.  Spanish interpreter utilized for this visit. ? ?Today he reports he was counseled by Dr. Richardo Hanks to stop finasteride and Gemtesa, though per chart review it appears Dr. Richardo Hanks advised him to continue these at his last visit.  It is unclear when he discontinued these.  Patient reports he is only taking tolterodine and his urinary symptoms are stable.  He is pleased with his urinary symptoms and does not wish to change his regimen. ? ?Per patient, he is currently taking ciprofloxacin prescribed by his PCP for an ear infection. ? ?In-office UA today positive for trace intact blood; urine microscopy pan negative. PVR 25mL.  IPSS 10/unhappy as below. ? ? IPSS   ? ? Row Name 03/14/22 1000  ?  ?  ?  ? International Prostate Symptom Score  ? How often have you had the sensation of not emptying your bladder? Not at All    ? How often have you had to urinate less than every two hours? Almost always    ? How often have you found you stopped and started again several times when you urinated? Less than 1 in 5 times    ? How often have you found it difficult to postpone urination? Not at All    ? How often have you had a weak urinary stream? Not at All    ? How often have you had to strain to start urination? Not at All    ? How many times did you typically get up at night to urinate? 4 Times    ? Total IPSS Score 10    ?  ? Quality of Life due to urinary symptoms  ? If you were to spend the rest of your life with your urinary condition just the way it is now how would you feel about that? Unhappy    ? ?  ?  ? ?  ? ? ? ?PMH: ?Past Medical History:  ?Diagnosis Date  ? BPH with obstruction/lower urinary tract symptoms   ? Erectile  dysfunction   ? Gout   ? Hemorrhoid   ? Hypertension   ? Nocturia   ? Urinary frequency   ? Urinary urgency   ? ? ?Surgical History: ?Past Surgical History:  ?Procedure Laterality Date  ? COLONOSCOPY    ? ? ?Home Medications:  ?Allergies as of 03/14/2022   ? ?   Reactions  ? Penicillins   ? Other reaction(s): Unknown  ? ?  ? ?  ?Medication List  ?  ? ?  ? Accurate as of March 14, 2022 10:38 AM. If you have any questions, ask your nurse or doctor.  ?  ?  ? ?  ? ?STOP taking these medications   ? ?finasteride 5 MG tablet ?Commonly known as: PROSCAR ?Stopped by: Carman Ching, PA-C ?  ?Gemtesa 75 MG Tabs ?Generic drug: Vibegron ?Stopped by: Carman Ching, PA-C ?  ? ?  ? ?TAKE these medications   ? ?amLODipine 10 MG tablet ?Commonly known as: NORVASC ?Take 10 mg by mouth daily. ?  ?atenolol 50 MG tablet ?Commonly known as: TENORMIN ?Take 50 mg by mouth daily. ?  ?ciprofloxacin 500 MG tablet ?Commonly known as: CIPRO ?Take 500 mg by mouth  2 (two) times daily. ?  ?omeprazole 40 MG capsule ?Commonly known as: PRILOSEC ?Take 1 capsule (40 mg total) by mouth daily before breakfast. ?  ?pravastatin 20 MG tablet ?Commonly known as: PRAVACHOL ?Take 20 mg by mouth daily. ?  ?tolterodine 4 MG 24 hr capsule ?Commonly known as: Detrol LA ?Take 1 capsule (4 mg total) by mouth daily. ?  ? ?  ? ? ?Allergies:  ?Allergies  ?Allergen Reactions  ? Penicillins   ?  Other reaction(s): Unknown  ? ? ?Family History: ?Family History  ?Problem Relation Age of Onset  ? Stroke Father   ? Diabetes Mellitus II Mother   ? Diabetes Other   ? Cancer Neg Hx   ?     Kidney,Prostate,Bladder  ? ? ?Social History:  ? reports that he has quit smoking. He has never used smokeless tobacco. He reports that he does not drink alcohol and does not use drugs. ? ?Physical Exam: ?BP 139/82   Pulse 93   Ht 5\' 8"  (1.727 m)   Wt 187 lb (84.8 kg)   BMI 28.43 kg/m?   ?Constitutional:  Alert and oriented, no acute distress, nontoxic appearing ?HEENT:  [place], AT ?Cardiovascular: No clubbing, cyanosis, or edema ?Respiratory: Normal respiratory effort, no increased work of breathing ?Skin: No rashes, bruises or suspicious lesions ?Neurologic: Grossly intact, no focal deficits, moving all 4 extremities ?Psychiatric: Normal mood and affect ? ?Laboratory Data: ?Results for orders placed or performed in visit on 03/14/22  ?Bladder Scan (Post Void Residual) in office  ?Result Value Ref Range  ? Scan Result 5   ? ?Assessment & Plan:   ?1. BPH with obstruction/lower urinary tract symptoms ?Off finasteride, unclear which provider instructed him to do this.  He reports stable urinary symptoms, so will defer restarting medication at this time.  He is emptying appropriately. ?- Urinalysis, Complete ?- Bladder Scan (Post Void Residual) in office ? ?2. OAB (overactive bladder) ?Off Gemtesa, unclear which provider instructed him to do this.  He reports stable urinary symptoms, so we will continue tolterodine only. ?- tolterodine (DETROL LA) 4 MG 24 hr capsule; Take 1 capsule (4 mg total) by mouth daily.  Dispense: 30 capsule; Refill: 11 ? ?Return in about 1 year (around 03/15/2023) for Annual f/u with Dr. 03/17/2023 with IPSS, PVR. ? ?Richardo Hanks, PA-C ? ?[facility] ?[address] ?[place], [place] ?[phone] ?   ?

## 2022-12-03 ENCOUNTER — Encounter: Payer: Self-pay | Admitting: Urology

## 2023-03-18 ENCOUNTER — Ambulatory Visit: Payer: Medicare HMO | Admitting: Urology

## 2023-03-25 ENCOUNTER — Ambulatory Visit: Payer: Medicare HMO | Admitting: Urology

## 2023-06-04 ENCOUNTER — Ambulatory Visit: Payer: Medicare HMO | Admitting: Urology

## 2023-06-04 VITALS — BP 166/84 | HR 78 | Wt 186.0 lb

## 2023-06-04 DIAGNOSIS — N401 Enlarged prostate with lower urinary tract symptoms: Secondary | ICD-10-CM | POA: Diagnosis not present

## 2023-06-04 DIAGNOSIS — N3281 Overactive bladder: Secondary | ICD-10-CM

## 2023-06-04 LAB — URINALYSIS, COMPLETE
Bilirubin, UA: NEGATIVE
Glucose, UA: NEGATIVE
Ketones, UA: NEGATIVE
Leukocytes,UA: NEGATIVE
Nitrite, UA: NEGATIVE
Protein,UA: NEGATIVE
Specific Gravity, UA: 1.015 (ref 1.005–1.030)
Urobilinogen, Ur: 0.2 mg/dL (ref 0.2–1.0)
pH, UA: 6 (ref 5.0–7.5)

## 2023-06-04 LAB — MICROSCOPIC EXAMINATION
Bacteria, UA: NONE SEEN
Epithelial Cells (non renal): NONE SEEN /hpf (ref 0–10)

## 2023-06-04 LAB — BLADDER SCAN AMB NON-IMAGING

## 2023-06-04 MED ORDER — TOLTERODINE TARTRATE ER 4 MG PO CP24: 4.0000 mg | ORAL_CAPSULE | Freq: Every day | ORAL | 3 refills | Status: DC

## 2023-06-04 MED ORDER — GEMTESA 75 MG PO TABS: 75.0000 mg | ORAL_TABLET | Freq: Every day | ORAL | 11 refills | Status: DC

## 2023-06-04 NOTE — Progress Notes (Signed)
   06/04/2023 8:45 AM   Alejandro Steele 23-Nov-1945 [id number]  Reason for visit: Follow up OAB/BPH, history of microscopic hematuria  HPI: 77 year old Spanish-speaking male who has been followed long-term for the above issues.  He was previously followed by Dr. Sherryl Barters.  Primary symptoms in the past have been urgency, frequency, and urge incontinence.  He underwent urodynamics in [place] in 2019 that showed maximum bladder capacity of only 100 cc, with detrusor instability/overactivity and uncontrolled involuntary contractions.  Max flow was 7 mL/S, with detrusor pressure at peak flow of 19 cm H2O. He also drinks quite a bit of coffee and soda during the day which contributes to his urinary symptoms.   He has a history of recurrent microscopic hematuria, and CT urogram in 2020 was benign and prostate measured 35 g.  He never followed up for cystoscopy, however he had a negative cystoscopy in 2018 with Dr. Sherryl Barters and opted to defer repeat cystoscopy.  Urinalysis today is pending.  I last saw him in May 2022, and he saw our PA Carman Ching in April 2023.  At the visit in 2022 I recommended adding Gemtesa to his Detrol and finasteride.  At his visit in April 2023 with Sam it sounds like he had stopped the finasteride and Gemtesa for unclear reasons and was only on the Detrol.  He opted to continue the Detrol alone at that visit.  Today, he reports ongoing urinary frequency.  He has nocturia 4-5 times overnight.  He denies any urge incontinence or leakage.  He continues to drink primarily Pepsi during the day.  He denies any gross hematuria or dysuria.  We again had a long conversation about his small capacity bladder and overactive bladder, as well as behavioral strategies including avoiding bladder irritants and timed voiding, minimizing fluids prior to bedtime.  I do think he would benefit from adding Gemtesa, and he was willing to consider that today.  Will have to see if this is  affordable with his health plan.  Follow-up urinalysis Gemtesa added to Detrol and refilled RTC 1 year PA   Sondra Come, MD  [facility] [address] [place], [place] [address] [phone]

## 2023-06-04 NOTE — Patient Instructions (Signed)
Vejiga hiperactiva en adultos Overactive Bladder, Adult  Vejiga hiperactiva es una afeccin en la que la persona tiene una necesidad sbita y frecuente de Garment/textile technologist. La persona tambin podra tener una prdida de orina si no puede llegar al bao con la rapidez suficiente (incontinencia urinaria). A veces, los sntomas pueden interferir en el trabajo o las actividades sociales. Cules son las causas? La vejiga hiperactiva est asociada con seales nerviosas deficientes entre la vejiga y Nurse, mental health. La vejiga puede recibir la seal de vaciarse antes de que est llena. Usted tambin puede tener msculos muy sensibles que hacen que la vejiga se contraiga demasiado pronto. Esta afeccin tambin puede deberse a otros factores, como los siguientes: Afecciones mdicas: Infeccin de las vas urinarias. Infeccin de los tejidos cercanos. Agrandamiento de la prstata. Clculos en la vejiga, inflamacin o tumores. Diabetes. Debilidad de los msculos o nervios, especialmente a causa de estas afecciones: Lesin en la mdula espinal. Accidente cerebrovascular. Esclerosis mltiple. Enfermedad de Parkinson. Otras causas: Ciruga en el tero o la uretra. Consumir cafena o alcohol en exceso. Ciertos medicamentos, especialmente aquellos que eliminan el exceso de lquido del cuerpo (diurticos). Estreimiento. Qu incrementa el riesgo? Puede correr un mayor riesgo de desarrollar vejiga hiperactiva si usted: Es Media planner. Fuma. Est atravesando la menopausia. Tiene problemas de prstata. Tiene una enfermedad neurolgica, como accidente cerebrovascular, demencia, enfermedad de Parkinson o esclerosis mltiple (EM). Come o toma alcohol, alimentos picantes, cafena y otras cosas que irritan la vejiga. Tiene sobrepeso u obesidad. Cules son los signos o sntomas? Los sntomas de esta afeccin incluyen una necesidad repentina y fuerte de Garment/textile technologist. Otros sntomas pueden incluir los siguientes: Prdida de  [place]. Orina 8 o ms veces por da. Levantarse para orinar 2 o ms veces durante la noche. Cmo se diagnostica? Esta afeccin se puede diagnosticar en funcin de lo siguiente: Los sntomas y los antecedentes mdicos. Un examen fsico. Anlisis de sangre o de orina para detectar posibles causas, como una infeccin. Es posible que tambin tenga que Teacher, adult education a un mdico especialista en problemas de las vas [place]. Este mdico es Heritage manager. Cmo se trata? El tratamiento para el trastorno de vejiga hiperactiva depende de la causa y la gravedad de su enfermedad. El tratamiento puede incluir: Entrenamiento de la vejiga, por ejemplo: Aprender a controlar la necesidad urgente de Garment/textile technologist siguiendo un programa para orinar en intervalos regulares. Hacer ejercicios de Kegel para fortalecer los msculos del piso plvico que sostienen la vejiga. Dispositivos especiales, por ejemplo: Biorretroalimentacin. Utiliza sensores para ayudarlo a Personnel officer atento a las seales del cuerpo. Estimulacin elctrica. Utiliza electrodos que se colocan dentro del cuerpo (implantados) o fuera del cuerpo. Estos electrodos envan pulsos elctricos suaves para fortalecer los nervios o los msculos que controlan la vejiga. Las mujeres pueden utilizar un dispositivo de plstico, llamado pesario, que calza en la vagina y sostiene la vejiga. Medicamentos, como por ejemplo: Antibiticos para tratar las infecciones en la vejiga. Antiespasmdicos para evitar que la vejiga elimine orina en el momento incorrecto. Antidepresivos tricclicos para relajar los msculos de la vejiga. Inyecciones de toxina botulnica tipo A directamente en el tejido de la vejiga para relajar los msculos de la vejiga. Ciruga, como: Puede implantarse un dispositivo para ayudar a Chief Technology Officer las seales nerviosas que controlan la miccin. Puede implantarse un electrodo para estimular las seales elctricas en la vejiga. Puede realizarse un procedimiento  para cambiar la forma de la vejiga. Esto solo se Merchandiser, retail graves. Siga estas instrucciones en su casa: Comida y  bebida  [employer] en su estilo de vida o dieta segn las recomendaciones de su mdico. Estos pueden incluir: Product manager lquidos a lo largo del da y no solo con las comidas. Reducir la ingesta de cafena o alcohol. Ingerir una dieta saludable y equilibrada para Multimedia programmer estreimiento. Esto puede incluir: Elegir alimentos ricos en fibra, como frijoles, cereales integrales y frutas y verduras frescas. Limitar el consumo de alimentos ricos en grasas y azcares procesados, como alimentos fritos o dulces. Estilo de vida  Baje de [place], si es necesario. No consuma ningn producto que contenga nicotina o tabaco. Estos incluyen cigarrillos, tabaco para mascar y aparatos de vapeo, como los Administrator, Civil Service. Si necesita ayuda para dejar de consumir estos productos, consulte al mdico. Indicaciones generales Use los medicamentos de venta libre y los recetados solamente como se lo haya indicado el mdico. Si le recetaron un antibitico, tmelo como se lo haya indicado el mdico. No deje de tomar el antibitico aunque comience a sentirse mejor. Use los implantes o el pesario como se lo haya indicado el mdico. Si es necesario, use apsitos para Environmental health practitioner cualquier prdida de orina que pueda [place]. Lleve un diario para registrar la cantidad de lquidos que ingiere y cundo lo hace, y cundo Special educational needs teacher. Esto ayudar a su mdico a Interior and spatial designer. Cumpla con todas las visitas de seguimiento. Esto es importante. Comunquese con un mdico si: Tiene fiebre o escalofros. Los sntomas no mejoran con Scientist, research (medical). El dolor y las molestias [place]. Tiene necesidad urgente de orinar con mayor frecuencia. Solicite ayuda de inmediato si: No puede controlar la vejiga. Resumen Vejiga hiperactiva se refiere a Insurance claims handler en la que la persona tiene una necesidad sbita y  frecuente de Geographical information systems officer. Varias afecciones pueden causar sntomas de vejiga hiperactiva. El tratamiento para la vejiga hiperactiva depende de la causa y la gravedad de la afeccin. Hacer cambios en el estilo de vida, hacer ejercicios de Kegel, llevar un diario y tomar medicamentos puede ayudar con esta afeccin. Esta informacin no tiene Theme park manager el consejo del mdico. Asegrese de hacerle al mdico cualquier pregunta que tenga. Document Revised: 09/07/2020 Document Reviewed: 08/14/2020 Elsevier Patient Education  2024 [employer].

## 2024-02-09 ENCOUNTER — Ambulatory Visit (INDEPENDENT_AMBULATORY_CARE_PROVIDER_SITE_OTHER): Admitting: Urology

## 2024-02-09 ENCOUNTER — Encounter: Payer: Self-pay | Admitting: Urology

## 2024-02-09 VITALS — BP 144/86 | HR 75 | Ht 68.0 in | Wt 186.0 lb

## 2024-02-09 DIAGNOSIS — N3281 Overactive bladder: Secondary | ICD-10-CM

## 2024-02-09 DIAGNOSIS — N138 Other obstructive and reflux uropathy: Secondary | ICD-10-CM | POA: Diagnosis not present

## 2024-02-09 DIAGNOSIS — N401 Enlarged prostate with lower urinary tract symptoms: Secondary | ICD-10-CM

## 2024-02-09 LAB — BLADDER SCAN AMB NON-IMAGING: Scan Result: 0

## 2024-02-09 MED ORDER — GEMTESA 75 MG PO TABS: 75.0000 mg | ORAL_TABLET | Freq: Every day | ORAL | 11 refills | Status: AC

## 2024-02-09 MED ORDER — TOLTERODINE TARTRATE ER 4 MG PO CP24: 4.0000 mg | ORAL_CAPSULE | Freq: Every day | ORAL | 3 refills | Status: AC

## 2024-02-09 NOTE — Progress Notes (Signed)
   02/09/2024 8:20 AM   Ayomikun Debera Lat 1946/11/11 [id number]  Reason for visit: Follow up OAB/BPH, history of microscopic hematuria  HPI: 78 year old Spanish-speaking male who has been followed long-term for the above issues.  He was previously followed by Dr. Sherryl Barters.  Primary symptoms in the past have been urgency, frequency, and urge incontinence. He underwent urodynamics in [place] in 2019 that showed maximum bladder capacity of only 100 cc, with detrusor instability/overactivity and uncontrolled involuntary contractions.  Max flow was 7 mL/S, with detrusor pressure at peak flow of 19 cm H2O. He also drinks quite a bit of coffee and soda during the day which contributes to his urinary symptoms.  He also has a history of recurrent microscopic hematuria, CT urogram in 2020 was benign and prostate measured 35 g, he deferred repeat cystoscopy, however had a negative cystoscopy in 2018 with Dr. Sherryl Barters.  Urinalysis in July 2024 was benign.  PSA in 2018 was normal at 0.5 and stable, screening discontinued per guideline recommendations.  PVR's have always been normal.  We have had a fair amount of challenges in terms of managing his care in terms of him taking medications as prescribed and avoiding bladder irritants.  At our visit in July 2024 I had recommended adding Gemtesa to his baseline Detrol.  He recently returned from [place].  He continues to report urinary frequency about every hour during the day, and nocturia 3 times overnight.  He is currently only taking the [place].  He continues to drink coffee during the day and some Pepsi.  PVR today normal again at 0 ml.  He denies any dysuria or gross hematuria.  I recommended going back on his previously prescribed medications with the Gemtesa and the Detrol, these can be taken together.  We reviewed behavioral strategies again.  Could consider PTNS in the future if no improvement.  RTC July 2025 as scheduled  Sondra Come,  MD  [facility] [address] [place], [place] [address] [phone]

## 2024-06-03 ENCOUNTER — Ambulatory Visit: Payer: Self-pay | Admitting: Physician Assistant

## 2024-06-08 ENCOUNTER — Ambulatory Visit: Admitting: Physician Assistant

## 2024-06-13 ENCOUNTER — Encounter: Payer: Self-pay | Admitting: Physician Assistant
